# Patient Record
Sex: Female | Born: 1995 | Race: White | Hispanic: No | Marital: Single | Smoking: Never smoker
Health system: Southern US, Community
[De-identification: ages and names within clinical notes are randomized; demographics above are authoritative.]

## PROBLEM LIST (undated history)

## (undated) ENCOUNTER — Inpatient Hospital Stay (HOSPITAL_COMMUNITY): Payer: Self-pay

## (undated) DIAGNOSIS — T7840XA Allergy, unspecified, initial encounter: Secondary | ICD-10-CM

## (undated) DIAGNOSIS — J309 Allergic rhinitis, unspecified: Principal | ICD-10-CM

## (undated) HISTORY — DX: Allergy, unspecified, initial encounter: T78.40XA

## (undated) HISTORY — PX: NO PAST SURGERIES: SHX2092

## (undated) HISTORY — DX: Allergic rhinitis, unspecified: J30.9

---
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
## 2014-01-11 ENCOUNTER — Ambulatory Visit: Payer: Medicaid Other | Admitting: Pediatrics

## 2014-07-18 DIAGNOSIS — Z0271 Encounter for disability determination: Secondary | ICD-10-CM

## 2014-09-26 ENCOUNTER — Other Ambulatory Visit: Payer: Self-pay | Admitting: Pediatrics

## 2014-09-26 ENCOUNTER — Encounter: Payer: Self-pay | Admitting: Pediatrics

## 2014-09-26 ENCOUNTER — Ambulatory Visit (INDEPENDENT_AMBULATORY_CARE_PROVIDER_SITE_OTHER): Payer: Medicaid Other | Admitting: Pediatrics

## 2014-09-26 VITALS — Wt 130.4 lb

## 2014-09-26 DIAGNOSIS — R1033 Periumbilical pain: Secondary | ICD-10-CM | POA: Diagnosis not present

## 2014-09-26 DIAGNOSIS — Z3202 Encounter for pregnancy test, result negative: Secondary | ICD-10-CM

## 2014-09-26 DIAGNOSIS — Z7251 High risk heterosexual behavior: Secondary | ICD-10-CM | POA: Diagnosis not present

## 2014-09-26 DIAGNOSIS — F329 Major depressive disorder, single episode, unspecified: Secondary | ICD-10-CM

## 2014-09-26 DIAGNOSIS — F32A Depression, unspecified: Secondary | ICD-10-CM

## 2014-09-26 LAB — POCT URINALYSIS DIPSTICK
Bilirubin, UA: NORMAL
Blood, UA: 250
Glucose, UA: NORMAL
Ketones, UA: NEGATIVE
NITRITE UA: NEGATIVE
PH UA: 5
PROTEIN UA: 1
Spec Grav, UA: 1.02
UROBILINOGEN UA: NEGATIVE

## 2014-09-26 LAB — POCT URINE PREGNANCY: PREG TEST UR: NEGATIVE

## 2014-09-26 NOTE — Progress Notes (Signed)
Subjective:     Patient ID: Jillian Richardson, female   DOB: 1995/10/31, 19 y.o.   MRN: [id number]  HPI :  19 year old female in by herself with c/o periumbilical abdominal pain which started 3-4 days ago.  She describes it as intermittent and sharp.  Four days ago she had unprotected sex with a new partner.  She is not aware that he had infection.  She denies dysuria, lower abd pain or vaginal discharge.  Denies nausea, vomiting, diarrhea, constipation, fever or flank pain.  She is on her menses now.  Has hx of depression and self-injury.  Is not in counseling.  Met with Sherilyn Dacosta, [facility], last year but did not schedule follow-up.   Review of Systems  Constitutional: Positive for appetite change. Negative for fever.  HENT: Negative.   Respiratory: Negative.   Gastrointestinal: Negative for nausea, vomiting, diarrhea and constipation.  Genitourinary: Negative for dysuria, frequency, flank pain, vaginal discharge, genital sores, menstrual problem, pelvic pain and dyspareunia.       Objective:   Physical Exam  Constitutional: She appears well-developed and well-nourished.  Pulmonary/Chest:  No flank pain  Abdominal: Soft. Bowel sounds are normal. She exhibits no distension and no mass. There is no tenderness.  Genitourinary:  Genitalia/pelvic exam deferred because no symptoms and on menses  Nursing note and vitals reviewed.      Assessment:     Recent unprotected sex Abdominal pain Hx of depression- missed [facility]     Plan:     Labs per orders  Will call patient with any abnormal results:  [phone]  Discussed birth control and STI's- gave pamphlets and condoms  Referral to [facility], [facility]    Ander Slade, PPCNP-BC

## 2014-09-27 LAB — GC/CHLAMYDIA PROBE AMP, URINE
CHLAMYDIA, SWAB/URINE, PCR: NEGATIVE
GC PROBE AMP, URINE: NEGATIVE

## 2014-09-27 LAB — URINE CULTURE: Colony Count: 60000

## 2015-02-07 ENCOUNTER — Ambulatory Visit: Payer: Medicaid Other | Admitting: Pediatrics

## 2015-02-12 ENCOUNTER — Ambulatory Visit (INDEPENDENT_AMBULATORY_CARE_PROVIDER_SITE_OTHER): Payer: Self-pay | Admitting: Pediatrics

## 2015-02-12 ENCOUNTER — Encounter: Payer: Self-pay | Admitting: Pediatrics

## 2015-02-12 ENCOUNTER — Encounter (INDEPENDENT_AMBULATORY_CARE_PROVIDER_SITE_OTHER): Payer: Self-pay

## 2015-02-12 VITALS — BP 100/80 | Ht 59.06 in | Wt 133.0 lb

## 2015-02-12 DIAGNOSIS — J0121 Acute recurrent ethmoidal sinusitis: Secondary | ICD-10-CM

## 2015-02-12 DIAGNOSIS — IMO0002 Reserved for concepts with insufficient information to code with codable children: Secondary | ICD-10-CM

## 2015-02-12 DIAGNOSIS — Z00129 Encounter for routine child health examination without abnormal findings: Secondary | ICD-10-CM

## 2015-02-12 DIAGNOSIS — Z23 Encounter for immunization: Secondary | ICD-10-CM

## 2015-02-12 DIAGNOSIS — Z113 Encounter for screening for infections with a predominantly sexual mode of transmission: Secondary | ICD-10-CM

## 2015-02-12 DIAGNOSIS — Z3202 Encounter for pregnancy test, result negative: Secondary | ICD-10-CM

## 2015-02-12 DIAGNOSIS — Z68.41 Body mass index (BMI) pediatric, greater than or equal to 95th percentile for age: Secondary | ICD-10-CM

## 2015-02-12 DIAGNOSIS — Z00121 Encounter for routine child health examination with abnormal findings: Secondary | ICD-10-CM

## 2015-02-12 MED ORDER — FLUTICASONE PROPIONATE 50 MCG/ACT NA SUSP: 2.0000 | Freq: Every day | NASAL | Status: DC

## 2015-02-12 MED ORDER — CETIRIZINE HCL 10 MG PO TABS: 10.0000 mg | ORAL_TABLET | Freq: Every day | ORAL | Status: DC

## 2015-02-12 NOTE — Progress Notes (Signed)
Routine Well-Adolescent Visit  PCP: TEBBEN,JACQUELINE, NP   History was provided by the mother. And patient  Jillian Richardson is a 19 y.o. female who is here for physical and to discuss birth control and any needed immunizations.  Current concerns: She thinks that she may be pregnant.  She wants HPV immunizations.  She needs her allergy meds. She is thinking about birth control.  She is sexually active but they are only using condoms.  She thinks she wants BCP but will considerate implant.  No other complaints.    Adolescent Assessment:  Confidentiality was discussed with the patient and if applicable, with caregiver as well.  Home and Environment:  Lives with: lives at home with mother and 3 younger sisters. Parental relations: good Friends/Peers: has a steady boyfriend.   Nutrition/Eating Behaviors: no concerns Sports/Exercise:  Walks.  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  Is not in school but would like to attend.  No idea what she would like to study.   Work: babysits Activities:   With parent out of the room and confidentiality discussed:   Patient reports being comfortable and safe at school and at home? Yes  Smoking: no Secondhand smoke exposure? no Drugs/EtOH: no  Menstruation:   Menarche: post menarchal, onset since early teens last menses if female: at present Menstrual History: flow is moderate   Sexuality: Sexually active? yes - 1 boyfriend sexual partners in last year:1 contraception use: condoms Last STI Screening: today  Mood: Suicidality and Depression: off and on depressed because she is looking for a job. Weapons:   Screenings: The patient completed the Rapid Assessment for Adolescent Preventive Services screening questionnaire and the following topics were identified as risk factors and discussed: healthy eating, exercise, condom use and birth control  In addition, the  following topics were discussed as part of anticipatory guidance mental health issues.  PHQ-9 completed and results indicated no major issues  Physical Exam:  BP 100/80 mmHg  Ht 4' 11.06" (1.5 m)  Wt 133 lb (60.328 kg)  BMI 26.81 kg/m2  LMP 02/09/2015 (Exact Date) Facility age limit for growth percentiles is 20 years.  General Appearance:   alert, oriented, no acute distress  HENT: Normocephalic, no obvious abnormality, conjunctiva clear  Mouth:   Normal appearing teeth, no obvious discoloration, dental caries, or dental caps  Neck:   Supple; thyroid: no enlargement, symmetric, no tenderness/mass/nodules  Lungs:   Clear to auscultation bilaterally, normal work of breathing  Heart:   Regular rate and rhythm, S1 and S2 normal, no murmurs;   Abdomen:   Soft, non-tender, no mass, or organomegaly  GU normal female external genitalia, pelvic not performed  Musculoskeletal:   Tone and strength strong and symmetrical, all extremities               Lymphatic:   No cervical adenopathy  Skin/Hair/Nails:   Skin warm, dry and intact, no rashes, no bruises or petechiae.    Neurologic:   Strength, gait, and coordination normal and age-appropriate    Assessment/Plan:  BMI: is not appropriate for age  Immunizations today: per orders.  Deferred and referred to HD for HPV.  She is aged out here.  - Follow-up visit in 1 week for next visit, or sooner as needed.  Will discuss her decision about BC.  We are available to place nexplanon if she agrees.  PEREZ-FIERY,Devesh Monforte, MD

## 2015-02-13 LAB — GC/CHLAMYDIA PROBE AMP, URINE
Chlamydia, Swab/Urine, PCR: NEGATIVE
GC Probe Amp, Urine: NEGATIVE

## 2015-02-19 ENCOUNTER — Encounter: Payer: Self-pay | Admitting: Pediatrics

## 2015-02-19 ENCOUNTER — Ambulatory Visit (INDEPENDENT_AMBULATORY_CARE_PROVIDER_SITE_OTHER): Payer: Medicaid Other | Admitting: Pediatrics

## 2015-02-19 VITALS — BP 104/62 | Ht 59.0 in | Wt 131.0 lb

## 2015-02-19 DIAGNOSIS — Z7251 High risk heterosexual behavior: Secondary | ICD-10-CM

## 2015-02-19 NOTE — Progress Notes (Signed)
Subjective:     Patient ID: Jillian Richardson, female   DOB: 07-Dec-1995, 19 y.o.   MRN: [id number]  HPI  Patient returns for follow up today.  [facility] and [facility] was discussed.  [facility].  We discussed the ease and reliability of nexplanon and she was going to think about it over the weekend.  She has been sexually active and said that she had a stable boyfriend. She returned today with her mom.  (Mom was also here last week and was involved in the conversation).  She said that she had decided that she was not going to be sexually active and did not want either the pill or the implant. We discussed need to anticipate sex and seek BC ahead if she decides to have intercourse.   I asked her if she wanted to speak with Neysa BonitoChristy from adolescent clinic to answer any questions she may have but she declined.   I said that any time she had any questions or concerns to please call us.  She agreed and said she would.  She has MA until she is 19 years of age she stated and would continue to come here.     Review of Systems  Constitutional: Negative.   All other systems reviewed and are negative.      Objective:   Physical Exam deferred     Assessment:     Decided to decline birth control  Has decided to abstain from sex.    Plan:     Will call us if she changes her mind and is in need of BC.  Maia Breslowenise Perez Fiery, MD

## 2015-02-25 ENCOUNTER — Encounter: Payer: Self-pay | Admitting: Pediatrics

## 2015-03-01 ENCOUNTER — Ambulatory Visit: Payer: Medicaid Other | Admitting: Pediatrics

## 2015-03-01 ENCOUNTER — Encounter: Payer: No Typology Code available for payment source | Admitting: Licensed Clinical Social Worker

## 2015-03-03 ENCOUNTER — Encounter (HOSPITAL_COMMUNITY): Payer: Self-pay

## 2015-03-03 ENCOUNTER — Emergency Department (HOSPITAL_COMMUNITY)
Admission: EM | Admit: 2015-03-03 | Discharge: 2015-03-03 | Disposition: A | Discharge status: Home / Self Care | Payer: Self-pay | Attending: Emergency Medicine | Admitting: Emergency Medicine

## 2015-03-03 DIAGNOSIS — Z7951 Long term (current) use of inhaled steroids: Secondary | ICD-10-CM | POA: Insufficient documentation

## 2015-03-03 DIAGNOSIS — N764 Abscess of vulva: Secondary | ICD-10-CM | POA: Insufficient documentation

## 2015-03-03 DIAGNOSIS — Z79899 Other long term (current) drug therapy: Secondary | ICD-10-CM | POA: Insufficient documentation

## 2015-03-03 MED ORDER — SULFAMETHOXAZOLE-TRIMETHOPRIM 800-160 MG PO TABS: 1.0000 | ORAL_TABLET | Freq: Two times a day (BID) | ORAL | Status: AC

## 2015-03-03 MED ORDER — LIDOCAINE-EPINEPHRINE (PF) 2 %-1:200000 IJ SOLN
20.0000 mL | Freq: Once | INTRAMUSCULAR | Status: AC
  Administered 2015-03-03: 20 mL
  Filled 2015-03-03: qty 20

## 2015-03-03 NOTE — ED Notes (Signed)
Assisted provider w/ pelvic procedure. Pt tolerated well.

## 2015-03-03 NOTE — ED Notes (Signed)
Pt here with parents in triage and reports an abscess to her left labia since 2 days ago.

## 2015-03-03 NOTE — Discharge Instructions (Signed)

## 2015-03-03 NOTE — ED Provider Notes (Signed)
CSN: [id number]     Arrival date & time 03/03/15  0106 History   This chart was scribed for Azalia Bilis, MD by Evon Slack, ED Scribe. This patient was seen in room A04C/A04C and the patient's care was started at 1:30 AM.      Chief Complaint  Patient presents with  . Abscess    The history is provided by the patient. No language interpreter was used.   HPI Comments: Jillian Richardson is a 19 y.o. female who presents to the Emergency Department complaining of new painful abscess on her left labia onset 2 days prior. Pt doesn't report treatments tried or medications PTA. Pt denies fever or chills.    Past Medical History  Diagnosis Date  . Allergy   . Allergic rhinitis 10/23/2013   History reviewed. No pertinent past surgical history. No family history on file. History  Substance Use Topics  . Smoking status: Never Smoker   . Smokeless tobacco: Not on file  . Alcohol Use: No   OB History    No data available     Review of Systems  Constitutional: Negative for fever and chills.  Skin:       Left labia abscess.   All other systems reviewed and are negative.    Allergies  Review of patient's allergies indicates no known allergies.  Home Medications   Prior to Admission medications   Medication Sig Start Date End Date Taking? Authorizing Provider  cetirizine (ZYRTEC) 10 MG tablet Take 1 tablet (10 mg total) by mouth daily. 02/12/15   Maia Breslow, MD  fluticasone (FLONASE) 50 MCG/ACT nasal spray Place 2 sprays into both nostrils daily. 02/12/15   Denise Perez-Fiery, MD   BP 106/64 mmHg  Pulse 89  Temp(Src) 98.6 F (37 C) (Oral)  Resp 15  Ht  (1.499 m)  Wt 133 lb 4 oz (60.442 kg)  BMI 26.90 kg/m2  SpO2 100%  LMP 02/09/2015 (Exact Date)   Physical Exam  Constitutional: She is oriented to person, place, and time. She appears well-developed and well-nourished. No distress.  HENT:  Head: Normocephalic and atraumatic.  Eyes: EOM are normal.  Neck:  Normal range of motion.  Cardiovascular: Normal rate, regular rhythm and normal heart sounds.   Pulmonary/Chest: Effort normal and breath sounds normal.  Abdominal: Soft. She exhibits no distension. There is no tenderness.  Genitourinary:  Left labia abscess with surrounding erythema, moderate amount of drainage. Chaperone Present.   Musculoskeletal: Normal range of motion.  Neurological: She is alert and oriented to person, place, and time.  Skin: Skin is warm and dry.  Psychiatric: She has a normal mood and affect. Judgment normal.  Nursing note and vitals reviewed.   ED Course  Procedures (including critical care time)  INCISION AND DRAINAGE PROCEDURE NOTE: Patient identification was confirmed and verbal consent was obtained. This procedure was performed by Azalia Bilis, MD at 2:12 AM. Site: Left labia Sterile procedures observed: Yes Needle size: 24 Gauge  Anesthetic used (type and amt): 2% lidocaine with Epi Blade size: 14 Drainage: small pus Complexity: Complex Packing used: None Site anesthetized, incision made over site, wound drained and explored loculations, rinsed with copious amounts of normal saline, wound packed with sterile gauze, covered with dry, sterile dressing.  Pt tolerated procedure well without complications.  Instructions for care discussed verbally and pt provided with additional written instructions for homecare and f/u.  DIAGNOSTIC STUDIES: Oxygen Saturation is 100% on RA, normal by my interpretation.    COORDINATION  OF CARE: 2:09 AM-Discussed treatment plan with pt at bedside and pt agreed to plan.     Labs Review Labs Reviewed - No data to display  Imaging Review No results found.   EKG Interpretation None      MDM   Final diagnoses:  Abscess of left genital labia   Tolerated the procedure well.  Discharge home in good condition.  Primary care follow-up.  Infection warnings given.  Patient understands to return the ER for new or  worsening symptoms.  Home on Bactrim.   I personally performed the services described in this documentation, which was scribed in my presence. The recorded information has been reviewed and is accurate.        Azalia Bilis, MD 03/03/15 [phone]

## 2015-03-06 ENCOUNTER — Encounter: Payer: No Typology Code available for payment source | Admitting: Licensed Clinical Social Worker

## 2015-03-06 ENCOUNTER — Ambulatory Visit: Payer: Medicaid Other | Admitting: Pediatrics

## 2015-03-06 ENCOUNTER — Telehealth: Payer: Self-pay | Admitting: Licensed Clinical Social Worker

## 2015-03-06 NOTE — Telephone Encounter (Signed)
LVM checking in on today's appt and asked if patient would be able to attend? Although it is well-documented that patient has been informed of the no-show policy, reiterated that policy and encouraged Karie to call with questions.   A few minutes later, patient officially no-showed appt.  Clide Deutscher, MSW, [employer] [facility]

## 2015-05-14 ENCOUNTER — Encounter: Payer: Self-pay | Admitting: Pediatrics

## 2015-07-15 ENCOUNTER — Telehealth: Payer: Self-pay | Admitting: Pediatrics

## 2015-07-15 NOTE — Telephone Encounter (Signed)
Left message for patient to call to schedule new patient appointment

## 2015-07-21 ENCOUNTER — Encounter: Payer: Self-pay | Admitting: Family Medicine

## 2015-07-22 ENCOUNTER — Encounter: Payer: Self-pay | Admitting: Pediatrics

## 2015-08-01 ENCOUNTER — Emergency Department (HOSPITAL_COMMUNITY)
Admission: EM | Admit: 2015-08-01 | Discharge: 2015-08-01 | Disposition: A | Discharge status: Home / Self Care | Payer: Medicaid Other | Attending: Emergency Medicine | Admitting: Emergency Medicine

## 2015-08-01 ENCOUNTER — Encounter (HOSPITAL_COMMUNITY): Payer: Self-pay | Admitting: Emergency Medicine

## 2015-08-01 DIAGNOSIS — Z8709 Personal history of other diseases of the respiratory system: Secondary | ICD-10-CM | POA: Insufficient documentation

## 2015-08-01 DIAGNOSIS — Z3202 Encounter for pregnancy test, result negative: Secondary | ICD-10-CM | POA: Insufficient documentation

## 2015-08-01 DIAGNOSIS — Z79899 Other long term (current) drug therapy: Secondary | ICD-10-CM | POA: Insufficient documentation

## 2015-08-01 DIAGNOSIS — L739 Follicular disorder, unspecified: Secondary | ICD-10-CM

## 2015-08-01 DIAGNOSIS — Z7951 Long term (current) use of inhaled steroids: Secondary | ICD-10-CM | POA: Insufficient documentation

## 2015-08-01 LAB — POC URINE PREG, ED: Preg Test, Ur: NEGATIVE

## 2015-08-01 NOTE — ED Provider Notes (Signed)
CSN: [id number]     Arrival date & time 08/01/15  2007 History  By signing my name below, I, Emmanuella Mensah, attest that this documentation has been prepared under the direction and in the presence of General MillsBenjamin Dallas Torok, PA-C. Electronically Signed: Angelene GiovanniEmmanuella Mensah, ED Scribe. 08/01/2015. 9:15 PM.    Chief Complaint  Patient presents with  . Wound Check   The history is provided by the patient. No language interpreter was used.   HPI Comments: Jillian Richardson is a 19 y.o. female who presents to the Emergency Department complaining of gradually worsening mildly painful area of swelling and redness to the left lateral pubis. Onset this morning. No alleviating factors noted. No interventions tried PTA. Pt has a hx of a cyst in the same area but she did not have to have a catheter at that time. She denies any fever, chills, constipation, n/v, dizziness, urinary symptoms, vaginal bleeding, vaginal discharge, pelvic pain, abdominal pain , dysuria. No other modifying factors.  Pt also requests a pregnancy test because she is approx. 5-6 days late on her menstrual cycle.    Past Medical History  Diagnosis Date  . Allergy   . Allergic rhinitis 10/23/2013   History reviewed. No pertinent past surgical history. No family history on file. Social History  Substance Use Topics  . Smoking status: Never Smoker   . Smokeless tobacco: None  . Alcohol Use: No   OB History    No data available     Review of Systems  Constitutional: Negative for fever and chills.  Gastrointestinal: Negative for nausea, vomiting and constipation.  Genitourinary: Negative for dysuria, hematuria, vaginal bleeding, vaginal discharge and vaginal pain.  Skin: Negative for rash and wound.       Area of swelling and redness to left lateral pubis  Neurological: Negative for dizziness.  All other systems reviewed and are negative.     Allergies  Review of patient's allergies indicates no known allergies.  Home  Medications   Prior to Admission medications   Medication Sig Start Date End Date Taking? Authorizing Provider  cetirizine (ZYRTEC) 10 MG tablet Take 1 tablet (10 mg total) by mouth daily. 02/12/15   Maia Breslowenise Perez-Fiery, MD  fluticasone (FLONASE) 50 MCG/ACT nasal spray Place 2 sprays into both nostrils daily. 02/12/15   Angelique Blonderenise Perez-Fiery, MD   BP 126/68 mmHg  Pulse 88  Temp(Src) 98.6 F (37 C) (Oral)  Resp 18  Ht 4\' 11"  (1.499 m)  Wt 61.689 kg  BMI 27.45 kg/m2  SpO2 99%  LMP 06/28/2015 Physical Exam  Constitutional: She is oriented to person, place, and time. She appears well-developed and well-nourished. No distress.  HENT:  Head: Normocephalic and atraumatic.  Mouth/Throat: Oropharynx is clear and moist.  Eyes: Conjunctivae and EOM are normal. Pupils are equal, round, and reactive to light. Right eye exhibits no discharge. Left eye exhibits no discharge. No scleral icterus.  Neck: Neck supple. No tracheal deviation present.  Cardiovascular: Normal rate, regular rhythm and normal heart sounds.   Pulmonary/Chest: Effort normal and breath sounds normal. No respiratory distress. She has no wheezes. She has no rales.  Abdominal: Soft. There is no tenderness.  Genitourinary:  Female chaperone present for entirety of vaginal exam. External genitalia appears normal, no evidence of cyst or obvious abscess. There is evidence of mild folliculitis on the lateral pubis. No overt erythema or fluctuance or tenderness.  Musculoskeletal: Normal range of motion. She exhibits no tenderness.  Neurological: She is alert and oriented to person, place,  and time.  Cranial Nerves II-XII grossly intact  Skin: Skin is warm and dry. No rash noted.  Psychiatric: She has a normal mood and affect. Her behavior is normal.  Nursing note and vitals reviewed.   ED Course  Procedures (including critical care time) DIAGNOSTIC STUDIES: Oxygen Saturation is 100% on RA, normal by my interpretation.    COORDINATION  OF CARE: 9:09 PM- Pt advised of plan for treatment and pt agrees. Explained that area will not be I&D'ed and recommended to use warm compresses on area. Recommended to return if area continues to be more red or if pt develops fever.    Labs Review Labs Reviewed  POC URINE PREG, ED   Joycie Peek, PA-C has personally reviewed and evaluated these lab results as part of his medical decision-making.  Meds given in ED:  Medications - No data to display  Discharge Medication List as of 08/01/2015 10:13 PM     Filed Vitals:   08/01/15 2025 08/01/15 2030 08/01/15 2030 08/01/15 2216  BP:  142/89 121/63 126/68  Pulse:  83 93 88  Temp:   98.3 F (36.8 C) 98.6 F (37 C)  TempSrc:   Oral   Resp:  Height:  (1.499 m)     Weight: 61.689 kg     SpO2:  100% 100% 99%    MDM  Jillian Richardson is a 19 y.o. female presents for evaluation of possible abscess. Found to have mild folliculitis over her left pubis region. Patient admits to recently shaving the area. No evidence of abscess or cellulitis. Discussed warm compress, personal hygiene at home. Pregnancy test is negative. Patient denies any abdominal or other pelvic/vaginal complaints. She is appropriate for discharge and agrees with this plan, as does her mother at bedside. The patient appears reasonably screened and/or stabilized for discharge and I doubt any other medical condition or other [facility] requiring further screening, evaluation, or treatment in the ED at this time prior to discharge.   Final diagnoses:  Folliculitis    I personally performed the services described in this documentation, which was scribed in my presence. The recorded information has been reviewed and is accurate.   Joycie Peek, PA-C 08/01/15 2219  Joycie Peek, PA-C 08/01/15 1914  Margarita Grizzle, MD 08/03/15 [phone]

## 2015-08-01 NOTE — ED Notes (Signed)
States my period is 5-6 days late so I would like a pregnancy test and if its negative I want birth control.  Reports cramping but no bleeding.  Also reports having an absess to the labia that she wants checked out.

## 2015-08-01 NOTE — Discharge Instructions (Signed)
Follow-up with your doctor as needed for reevaluation. Return to ED for any new or worsening symptoms as we discussed  Folliculitis Folliculitis is redness, soreness, and swelling (inflammation) of the hair follicles. This condition can occur anywhere on the body. People with weakened immune systems, diabetes, or obesity have a greater risk of getting folliculitis. CAUSES  Bacterial infection. This is the most common cause.  Fungal infection.  Viral infection.  Contact with certain chemicals, especially oils and tars. Long-term folliculitis can result from bacteria that live in the nostrils. The bacteria may trigger multiple outbreaks of folliculitis over time. SYMPTOMS Folliculitis most commonly occurs on the scalp, thighs, legs, back, buttocks, and areas where hair is shaved frequently. An early sign of folliculitis is a small, white or yellow, pus-filled, itchy lesion (pustule). These lesions appear on a red, inflamed follicle. They are usually less than 0.2 inches (5 mm) wide. When there is an infection of the follicle that goes deeper, it becomes a boil or furuncle. A group of closely packed boils creates a larger lesion (carbuncle). Carbuncles tend to occur in hairy, sweaty areas of the body. DIAGNOSIS  Your caregiver can usually tell what is wrong by doing a physical exam. A sample may be taken from one of the lesions and tested in a lab. This can help determine what is causing your folliculitis. TREATMENT  Treatment may include:  Applying warm compresses to the affected areas.  Taking antibiotic medicines orally or applying them to the skin.  Draining the lesions if they contain a large amount of pus or fluid.  Laser hair removal for cases of long-lasting folliculitis. This helps to prevent regrowth of the hair. HOME CARE INSTRUCTIONS  Apply warm compresses to the affected areas as directed by your caregiver.  If antibiotics are prescribed, take them as directed. Finish them  even if you start to feel better.  You may take over-the-counter medicines to relieve itching.  Do not shave irritated skin.  Follow up with your caregiver as directed. SEEK IMMEDIATE MEDICAL CARE IF:   You have increasing redness, swelling, or pain in the affected area.  You have a fever. MAKE SURE YOU:  Understand these instructions.  Will watch your condition.  Will get help right away if you are not doing well or get worse.   This information is not intended to replace advice given to you by your health care provider. Make sure you discuss any questions you have with your health care provider.   Document Released: 09/28/2001 Document Revised: 08/10/2014 Document Reviewed: 10/20/2011 Elsevier Interactive Patient Education [employer].

## 2015-08-07 ENCOUNTER — Telehealth: Payer: Self-pay | Admitting: Pediatrics

## 2015-08-07 ENCOUNTER — Encounter: Payer: Self-pay | Admitting: Family Medicine

## 2015-08-07 NOTE — Telephone Encounter (Signed)
Left message for patient regarding cancellation of appointment for 08/08/15. Advised patient to call to reschedule. 

## 2015-08-08 ENCOUNTER — Ambulatory Visit: Payer: Medicaid Other | Admitting: Family Medicine

## 2015-08-18 ENCOUNTER — Encounter: Payer: Self-pay | Admitting: Family Medicine

## 2015-08-29 ENCOUNTER — Ambulatory Visit: Payer: Medicaid Other | Admitting: Family Medicine

## 2015-09-03 ENCOUNTER — Encounter: Payer: Self-pay | Admitting: Family Medicine

## 2015-10-04 ENCOUNTER — Encounter: Payer: Self-pay | Admitting: Family Medicine

## 2015-10-04 ENCOUNTER — Ambulatory Visit (INDEPENDENT_AMBULATORY_CARE_PROVIDER_SITE_OTHER): Payer: Self-pay | Admitting: Family Medicine

## 2015-10-04 DIAGNOSIS — Z0000 Encounter for general adult medical examination without abnormal findings: Secondary | ICD-10-CM

## 2015-10-04 LAB — COMPLETE METABOLIC PANEL WITH GFR
ALBUMIN: 4.5 g/dL (ref 3.6–5.1)
ALT: 11 U/L (ref 6–29)
AST: 12 U/L (ref 10–30)
Alkaline Phosphatase: 62 U/L (ref 33–115)
BILIRUBIN TOTAL: 0.2 mg/dL (ref 0.2–1.2)
BUN: 18 mg/dL (ref 7–25)
CHLORIDE: 101 mmol/L (ref 98–110)
CO2: 29 mmol/L (ref 20–31)
CREATININE: 0.58 mg/dL (ref 0.50–1.10)
Calcium: 9.6 mg/dL (ref 8.6–10.2)
GFR, Est African American: >89 mL/min (ref >=60)
GLUCOSE: 70 mg/dL (ref 65–99)
POTASSIUM: 4 mmol/L (ref 3.5–5.3)
SODIUM: 137 mmol/L (ref 135–146)
Total Protein: 7.1 g/dL (ref 6.1–8.1)

## 2015-10-04 LAB — HEMOGLOBIN A1C
Hgb A1c MFr Bld: 4.9 % (ref <5.7)
Mean Plasma Glucose: 94 mg/dL (ref <117)

## 2015-10-04 LAB — CBC WITH DIFFERENTIAL/PLATELET
BASOS PCT: 0 % (ref 0–1)
Basophils Absolute: 0 10*3/uL (ref 0.0–0.1)
EOS ABS: 0.3 10*3/uL (ref 0.0–0.7)
EOS PCT: 3 % (ref 0–5)
HCT: 38.7 % (ref 36.0–46.0)
HEMOGLOBIN: 13.1 g/dL (ref 12.0–15.0)
Lymphocytes Relative: 31 % (ref 12–46)
Lymphs Abs: 2.7 10*3/uL (ref 0.7–4.0)
MCH: 32.1 pg (ref 26.0–34.0)
MCHC: 33.9 g/dL (ref 30.0–36.0)
MCV: 94.9 fL (ref 78.0–100.0)
MONO ABS: 0.5 10*3/uL (ref 0.1–1.0)
MONOS PCT: 6 % (ref 3–12)
MPV: 10 fL (ref 8.6–12.4)
NEUTROS ABS: 5.2 10*3/uL (ref 1.7–7.7)
Neutrophils Relative %: 60 % (ref 43–77)
Platelets: 276 10*3/uL (ref 150–400)
RBC: 4.08 MIL/uL (ref 3.87–5.11)
RDW: 12.2 % (ref 11.5–15.5)
WBC: 8.7 10*3/uL (ref 4.0–10.5)

## 2015-10-04 NOTE — Patient Instructions (Signed)
Health Maintenance, Female Adopting a healthy lifestyle and getting preventive care can go a long way to promote health and wellness. Talk with your health care provider about what schedule of regular examinations is right for you. This is a good chance for you to check in with your provider about disease prevention and staying healthy. In between checkups, there are plenty of things you can do on your own. Experts have done a lot of research about which lifestyle changes and preventive measures are most likely to keep you healthy. Ask your health care provider for more information. WEIGHT AND DIET  Eat a healthy diet  Be sure to include plenty of vegetables, fruits, low-fat dairy products, and lean protein.  Do not eat a lot of foods high in solid fats, added sugars, or salt.  Get regular exercise. This is one of the most important things you can do for your health.  Most adults should exercise for at least 150 minutes each week. The exercise should increase your heart rate and make you sweat (moderate-intensity exercise).  Most adults should also do strengthening exercises at least twice a week. This is in addition to the moderate-intensity exercise.  Maintain a healthy weight  Body mass index (BMI) is a measurement that can be used to identify possible weight problems. It estimates body fat based on height and weight. Your health care provider can help determine your BMI and help you achieve or maintain a healthy weight.  For females 20 years of age and older:   A BMI below 18.5 is considered underweight.  A BMI of 18.5 to 24.9 is normal.  A BMI of 25 to 29.9 is considered overweight.  A BMI of 30 and above is considered obese.  Watch levels of cholesterol and blood lipids  You should start having your blood tested for lipids and cholesterol at 20 years of age, then have this test every 5 years.  You may need to have your cholesterol levels checked more often if:  Your lipid  or cholesterol levels are high.  You are older than 21 years of age.  You are at high risk for heart disease.  CANCER SCREENING   Lung Cancer  Lung cancer screening is recommended for adults 55-80 years old who are at high risk for lung cancer because of a history of smoking.  A yearly low-dose CT scan of the lungs is recommended for people who:  Currently smoke.  Have quit within the past 15 years.  Have at least a 30-pack-year history of smoking. A pack year is smoking an average of one pack of cigarettes a day for 1 year.  Yearly screening should continue until it has been 15 years since you quit.  Yearly screening should stop if you develop a health problem that would prevent you from having lung cancer treatment.  Breast Cancer  Practice breast self-awareness. This means understanding how your breasts normally appear and feel.  It also means doing regular breast self-exams. Let your health care provider know about any changes, no matter how small.  If you are in your 20s or 30s, you should have a clinical breast exam (CBE) by a health care provider every 1-3 years as part of a regular health exam.  If you are 40 or older, have a CBE every year. Also consider having a breast X-ray (mammogram) every year.  If you have a family history of breast cancer, talk to your health care provider about genetic screening.  If you   are at high risk for breast cancer, talk to your health care provider about having an MRI and a mammogram every year.  Breast cancer gene (BRCA) assessment is recommended for women who have family members with BRCA-related cancers. BRCA-related cancers include:  Breast.  Ovarian.  Tubal.  Peritoneal cancers.  Results of the assessment will determine the need for genetic counseling and BRCA1 and BRCA2 testing. Cervical Cancer Your health care provider may recommend that you be screened regularly for cancer of the pelvic organs (ovaries, uterus, and  vagina). This screening involves a pelvic examination, including checking for microscopic changes to the surface of your cervix (Pap test). You may be encouraged to have this screening done every 3 years, beginning at age 21.  For women ages 30-65, health care providers may recommend pelvic exams and Pap testing every 3 years, or they may recommend the Pap and pelvic exam, combined with testing for human papilloma virus (HPV), every 5 years. Some types of HPV increase your risk of cervical cancer. Testing for HPV may also be done on women of any age with unclear Pap test results.  Other health care providers may not recommend any screening for nonpregnant women who are considered low risk for pelvic cancer and who do not have symptoms. Ask your health care provider if a screening pelvic exam is right for you.  If you have had past treatment for cervical cancer or a condition that could lead to cancer, you need Pap tests and screening for cancer for at least 20 years after your treatment. If Pap tests have been discontinued, your risk factors (such as having a new sexual partner) need to be reassessed to determine if screening should resume. Some women have medical problems that increase the chance of getting cervical cancer. In these cases, your health care provider may recommend more frequent screening and Pap tests. Colorectal Cancer  This type of cancer can be detected and often prevented.  Routine colorectal cancer screening usually begins at 21 years of age and continues through 21 years of age.  Your health care provider may recommend screening at an earlier age if you have risk factors for colon cancer.  Your health care provider may also recommend using home test kits to check for hidden blood in the stool.  A small camera at the end of a tube can be used to examine your colon directly (sigmoidoscopy or colonoscopy). This is done to check for the earliest forms of colorectal  cancer.  Routine screening usually begins at age 50.  Direct examination of the colon should be repeated every 5-10 years through 21 years of age. However, you may need to be screened more often if early forms of precancerous polyps or small growths are found. Skin Cancer  Check your skin from head to toe regularly.  Tell your health care provider about any new moles or changes in moles, especially if there is a change in a mole's shape or color.  Also tell your health care provider if you have a mole that is larger than the size of a pencil eraser.  Always use sunscreen. Apply sunscreen liberally and repeatedly throughout the day.  Protect yourself by wearing long sleeves, pants, a wide-brimmed hat, and sunglasses whenever you are outside. HEART DISEASE, DIABETES, AND HIGH BLOOD PRESSURE   High blood pressure causes heart disease and increases the risk of stroke. High blood pressure is more likely to develop in:  People who have blood pressure in the high end   of the normal range (130-139/85-89 mm Hg).  People who are overweight or obese.  People who are African American.  If you are 38-23 years of age, have your blood pressure checked every 3-5 years. If you are 61 years of age or older, have your blood pressure checked every year. You should have your blood pressure measured twice--once when you are at a hospital or clinic, and once when you are not at a hospital or clinic. Record the average of the two measurements. To check your blood pressure when you are not at a hospital or clinic, you can use:  An automated blood pressure machine at a pharmacy.  A home blood pressure monitor.  If you are between 45 years and 39 years old, ask your health care provider if you should take aspirin to prevent strokes.  Have regular diabetes screenings. This involves taking a blood sample to check your fasting blood sugar level.  If you are at a normal weight and have a low risk for diabetes,  have this test once every three years after 21 years of age.  If you are overweight and have a high risk for diabetes, consider being tested at a younger age or more often. PREVENTING INFECTION  Hepatitis B  If you have a higher risk for hepatitis B, you should be screened for this virus. You are considered at high risk for hepatitis B if:  You were born in a country where hepatitis B is common. Ask your health care provider which countries are considered high risk.  Your parents were born in a high-risk country, and you have not been immunized against hepatitis B (hepatitis B vaccine).  You have HIV or AIDS.  You use needles to inject street drugs.  You live with someone who has hepatitis B.  You have had sex with someone who has hepatitis B.  You get hemodialysis treatment.  You take certain medicines for conditions, including cancer, organ transplantation, and autoimmune conditions. Hepatitis C  Blood testing is recommended for:  Everyone born from [birth year] through 1966.  Anyone with known risk factors for hepatitis C. Sexually transmitted infections (STIs)  You should be screened for sexually transmitted infections (STIs) including gonorrhea and chlamydia if:  You are sexually active and are younger than 21 years of age.  You are older than 21 years of age and your health care provider tells you that you are at risk for this type of infection.  Your sexual activity has changed since you were last screened and you are at an increased risk for chlamydia or gonorrhea. Ask your health care provider if you are at risk.  If you do not have HIV, but are at risk, it may be recommended that you take a prescription medicine daily to prevent HIV infection. This is called pre-exposure prophylaxis (PrEP). You are considered at risk if:  You are sexually active and do not regularly use condoms or know the HIV status of your partner(s).  You take drugs by injection.  You are sexually  active with a partner who has HIV. Talk with your health care provider about whether you are at high risk of being infected with HIV. If you choose to begin PrEP, you should first be tested for HIV. You should then be tested every 3 months for as long as you are taking PrEP.  PREGNANCY   If you are premenopausal and you may become pregnant, ask your health care provider about preconception counseling.  If you may  become pregnant, take 400 to 800 micrograms (mcg) of folic acid every day.  If you want to prevent pregnancy, talk to your health care provider about birth control (contraception). OSTEOPOROSIS AND MENOPAUSE   Osteoporosis is a disease in which the bones lose minerals and strength with aging. This can result in serious bone fractures. Your risk for osteoporosis can be identified using a bone density scan.  If you are 61 years of age or older, or if you are at risk for osteoporosis and fractures, ask your health care provider if you should be screened.  Ask your health care provider whether you should take a calcium or vitamin D supplement to lower your risk for osteoporosis.  Menopause may have certain physical symptoms and risks.  Hormone replacement therapy may reduce some of these symptoms and risks. Talk to your health care provider about whether hormone replacement therapy is right for you.  HOME CARE INSTRUCTIONS   Schedule regular health, dental, and eye exams.  Stay current with your immunizations.   Do not use any tobacco products including cigarettes, chewing tobacco, or electronic cigarettes.  If you are pregnant, do not drink alcohol.  If you are breastfeeding, limit how much and how often you drink alcohol.  Limit alcohol intake to no more than 1 drink per day for nonpregnant women. One drink equals 12 ounces of beer, 5 ounces of wine, or 1 ounces of hard liquor.  Do not use street drugs.  Do not share needles.  Ask your health care provider for help if  you need support or information about quitting drugs.  Tell your health care provider if you often feel depressed.  Tell your health care provider if you have ever been abused or do not feel safe at home.   This information is not intended to replace advice given to you by your health care provider. Make sure you discuss any questions you have with your health care provider.   Document Released: 02/02/2011 Document Revised: 08/10/2014 Document Reviewed: 06/21/2013 Elsevier Interactive Patient Education [employer].

## 2015-10-04 NOTE — Progress Notes (Signed)
 Subjective:    Patient ID: Jillian Richardson, female    DOB: 09/24/1995, 19 y.o.   MRN: [id number]  HPI [name], a 19 year old female with a history of hypertension. She states that she was a patient of Dr Denise Perez-Fiery, pediatrician prior to 1 year ago. She maintains that she fells well and is without complaint. [name] has a normal menstrual cycle. Her last menstrual period started on 10/02/2015. She has not been sexually active in greater than 1 year. She denies performing monthly self-breast examination. She does not wear glasses or contact lenses.  Patient states that it has been several years since last dental visit. She does not eat a balanced diet or drink water. She denies headache, fatigue, chest pain, palpitations, dysuria, nausea, vomiting, or diarrhea.   Past Medical History  Diagnosis Date  . Allergy   . Allergic rhinitis 10/23/2013   Immunization History  Administered Date(s) Administered  . DTaP 12/28/1995, 04/19/1996, 06/14/1996, 01/30/1997, 04/10/1999  . Hepatitis A, Ped/Adol-2 Dose 12/05/2013  . Hepatitis B 12/27/1994, 12/28/1995, 04/19/1996  . HiB (PRP-OMP) 04/19/1996  . IPV 12/28/1995, 04/19/1996, 06/14/1996, 04/10/1999  . MMR 12/28/1995, 04/10/1999  . Meningococcal Conjugate 12/05/2013  . Tdap 03/26/2007  . Varicella 12/05/2013  History reviewed. No pertinent past surgical history.   Review of Systems  Constitutional: Negative.  Negative for unexpected weight change.  HENT: Negative.   Eyes: Negative.   Respiratory: Negative.   Cardiovascular: Negative.  Negative for chest pain, palpitations and leg swelling.  Gastrointestinal: Negative.   Endocrine: Negative.   Genitourinary: Negative.  Negative for hematuria, menstrual problem and pelvic pain.  Musculoskeletal: Negative for myalgias and joint swelling.  Allergic/Immunologic: Negative.   Neurological: Negative.   Hematological: Negative.   Psychiatric/Behavioral: Negative.       Objective:   Physical Exam  Constitutional: She is oriented to person, place, and time. She appears well-developed and well-nourished.  HENT:  Head: Normocephalic and atraumatic.  Right Ear: External ear normal.  Left Ear: External ear normal.  Nose: Nose normal.  Mouth/Throat: Oropharynx is clear and moist. Abnormal dentition. Dental caries present.  Eyes: Conjunctivae and EOM are normal. Pupils are equal, round, and reactive to light.  Neck: Normal range of motion. Neck supple.  Cardiovascular: Normal rate, regular rhythm, normal heart sounds and intact distal pulses.   Pulmonary/Chest: Effort normal and breath sounds normal.  Abdominal: Soft. Bowel sounds are normal.  Musculoskeletal: Normal range of motion.  Neurological: She is alert and oriented to person, place, and time. She has normal reflexes.  Skin: Skin is warm and dry.  Psychiatric: She has a normal mood and affect. Her behavior is normal. Judgment and thought content normal.     BP 115/56 mmHg  Pulse 88  Temp(Src) 98.7 F (37.1 C) (Oral)  Resp 18  Ht 4' 11" (1.499 m)  Wt 147 lb (66.679 kg)  BMI 29.67 kg/m2  SpO2 100%  LMP 10/04/2015 Assessment & Plan:   1. Annual physical exam Recommend monthly self-breast exams as discussed.  Recommend barrier protection with sexual intercourse -Recommend a lowfat -low carbohydrate diet divided over 5-6 small meal -Increase water intake to 6-8 glasses -150 minutes per week of cardiovascular exercise.  - CBC with Differential - COMPLETE METABOLIC PANEL WITH GFR - Hemoglobin A1c - TSH - Lipid Panel; Future - Flu Vaccine QUAD 36+ mos PF IM (Fluarix & Fluzone Quad PF) -Recommend dental examination  -Will follow up in 3 months for routine gynecological exam and pap   smear.  -Also recommend starting HPV series   RTC: 3 months for pap smear     Hollis,Lachina M, FNP   The patient was given clear instructions to go to ER or return to medical center if symptoms do not  improve, worsen or new problems develop. The patient verbalized understanding. Will notify patient with laboratory results.         

## 2015-10-04 NOTE — Progress Notes (Signed)
Patient is here to Establish Care as a new patient.  Patient denies pain at this time.  Patient would like the flu shot today

## 2015-10-05 LAB — TSH: TSH: 3.68 m[IU]/L

## 2015-10-07 ENCOUNTER — Telehealth: Payer: Self-pay

## 2015-10-07 NOTE — Telephone Encounter (Signed)
Called, patient was not home. Left a message with father to have her call us back when available. Thanks!

## 2015-10-07 NOTE — Telephone Encounter (Signed)
-----   Message from Massie MaroonLachina M Hollis, [place] sent at 10/07/2015 10:57 AM EST ----- Regarding: lab results Please inform Jillian Richardson that all labs are within a normal range. Please follow up in office as needed.   Thanks ----- Message -----    From: [facility]    Sent: 10/05/2015   1:27 AM      To: Massie MaroonLachina M Hollis, FNP

## 2015-10-08 NOTE — Telephone Encounter (Signed)
Patient returned call and I advised of normal labs and to follow up only when needed. Thanks!

## 2015-10-09 ENCOUNTER — Encounter: Payer: Self-pay | Admitting: Family Medicine

## 2015-10-11 ENCOUNTER — Other Ambulatory Visit (INDEPENDENT_AMBULATORY_CARE_PROVIDER_SITE_OTHER): Payer: Self-pay

## 2015-10-11 DIAGNOSIS — Z0000 Encounter for general adult medical examination without abnormal findings: Secondary | ICD-10-CM

## 2015-10-11 LAB — LIPID PANEL
CHOLESTEROL: 191 mg/dL — AB (ref 125–170)
HDL: 70 mg/dL (ref >=46)
LDL Cholesterol: 93 mg/dL (ref <110)
TRIGLYCERIDES: 141 mg/dL (ref <150)
Total CHOL/HDL Ratio: 2.7 Ratio (ref <=5.0)
VLDL: 28 mg/dL (ref <30)

## 2015-10-25 ENCOUNTER — Other Ambulatory Visit: Payer: Self-pay

## 2015-11-05 ENCOUNTER — Encounter: Payer: Self-pay | Admitting: Family Medicine

## 2016-01-06 ENCOUNTER — Ambulatory Visit (INDEPENDENT_AMBULATORY_CARE_PROVIDER_SITE_OTHER): Payer: Medicaid Other | Admitting: Family Medicine

## 2016-01-06 ENCOUNTER — Encounter: Payer: Self-pay | Admitting: Family Medicine

## 2016-01-06 ENCOUNTER — Other Ambulatory Visit (HOSPITAL_COMMUNITY)
Admission: RE | Admit: 2016-01-06 | Discharge: 2016-01-06 | Disposition: A | Discharge status: Home / Self Care | Payer: Medicaid Other | Source: Ambulatory Visit | Attending: Family Medicine | Admitting: Family Medicine

## 2016-01-06 VITALS — BP 115/63 | HR 92 | Temp 98.3°F | Resp 14 | Wt 154.0 lb

## 2016-01-06 DIAGNOSIS — Z01419 Encounter for gynecological examination (general) (routine) without abnormal findings: Secondary | ICD-10-CM

## 2016-01-06 DIAGNOSIS — Z113 Encounter for screening for infections with a predominantly sexual mode of transmission: Secondary | ICD-10-CM | POA: Insufficient documentation

## 2016-01-06 DIAGNOSIS — Z0000 Encounter for general adult medical examination without abnormal findings: Secondary | ICD-10-CM

## 2016-01-06 DIAGNOSIS — N760 Acute vaginitis: Secondary | ICD-10-CM | POA: Insufficient documentation

## 2016-01-06 NOTE — Progress Notes (Signed)
Subjective:    Patient ID: Jillian Richardson, female    DOB: 10/03/1995, 20 y.o.   MRN: [id number]  Gynecologic Exam The patient's pertinent negatives include no genital itching, genital lesions, genital odor, genital rash, missed menses, pelvic pain, vaginal bleeding or vaginal discharge. The patient is experiencing no pain. She is not pregnant. Pertinent negatives include no abdominal pain, anorexia, back pain, chills, constipation, diarrhea, discolored urine, dysuria, fever, flank pain, frequency, headaches, hematuria, joint pain, joint swelling, nausea, painful intercourse, rash, sore throat, urgency or vomiting. She is not sexually active. She uses nothing for contraception. Her menstrual history has been regular. There is no history of a Cesarean section, an ectopic pregnancy, herpes simplex, miscarriage, perineal abscess, an STD, a terminated pregnancy or vaginosis.   Immunization History  Administered Date(s) Administered  . DTaP 12/28/1995, 04/19/1996, 06/14/1996, 01/30/1997, 04/10/1999  . Hepatitis A, Ped/Adol-2 Dose 12/05/2013  . Hepatitis B 12/07/94, 12/28/1995, 04/19/1996  . HiB (PRP-OMP) 04/19/1996  . IPV 12/28/1995, 04/19/1996, 06/14/1996, 04/10/1999  . Influenza,inj,Quad PF,36+ Mos 10/04/2015  . MMR 12/28/1995, 04/10/1999  . Meningococcal Conjugate 12/05/2013  . Tdap 03/26/2007  . Varicella 12/05/2013  No Known Allergies  Social History   Social History  . Marital Status: Single    Spouse Name: N/A  . Number of Children: N/A  . Years of Education: N/A   Occupational History  . Not on file.   Social History Main Topics  . Smoking status: Never Smoker   . Smokeless tobacco: Not on file  . Alcohol Use: No  . Drug Use: No  . Sexual Activity: Yes    Birth Control/ Protection: None   Other Topics Concern  . Not on file   Social History Narrative   Review of Systems  Constitutional: Negative for fever and chills.  HENT: Negative.  Negative for sore throat.    Eyes: Negative.   Respiratory: Negative.   Cardiovascular: Negative.   Gastrointestinal: Negative.  Negative for nausea, vomiting, abdominal pain, diarrhea, constipation and anorexia.  Endocrine: Negative.  Negative for polydipsia, polyphagia and polyuria.  Genitourinary: Negative.  Negative for dysuria, urgency, frequency, hematuria, flank pain, vaginal discharge, pelvic pain and missed menses.  Musculoskeletal: Negative.  Negative for back pain and joint pain.  Skin: Negative.  Negative for rash.  Allergic/Immunologic: Negative.  Negative for immunocompromised state.  Neurological: Negative.  Negative for headaches.  Hematological: Negative.   Psychiatric/Behavioral: Negative.        Objective:   Physical Exam  Constitutional: She appears well-developed and well-nourished.  HENT:  Head: Normocephalic and atraumatic.  Right Ear: External ear normal.  Left Ear: External ear normal.  Nose: Nose normal.  Mouth/Throat: Oropharynx is clear and moist.  Eyes: Conjunctivae and EOM are normal. Pupils are equal, round, and reactive to light.  Neck: Normal range of motion. Neck supple.  Cardiovascular: Normal rate, regular rhythm, normal heart sounds and intact distal pulses.   Pulmonary/Chest: Effort normal and breath sounds normal.  Abdominal: Soft. Bowel sounds are normal.  Genitourinary: Rectum normal and uterus normal. No breast swelling, tenderness, discharge or bleeding. No labial fusion. There is no rash, tenderness, lesion or injury on the right labia. There is no rash, tenderness, lesion or injury on the left labia. No tenderness or bleeding in the vagina. No foreign body around the vagina. No vaginal discharge found.  Lymphadenopathy:    She has no cervical adenopathy.    She has no axillary adenopathy.  Neurological: She has normal strength. She displays a negative  Romberg sign.  Skin: Skin is warm, dry and intact.      BP 115/63 mmHg  Pulse 92  Temp(Src) 98.3 F (36.8 C)  (Oral)  Resp 14  Wt 154 lb (69.854 kg)  SpO2 100%  LMP 12/31/2015  Assessment & Plan:  1. Encounter for routine gynecological examination Recommend barrier protection with sexual intercourse.  Recommend monthly self-breast examination - Cytology - PAP [place] - Urinalysis Dipstick  Routine Health Maintenance:  Recommend a lowfat, low carbohydrate diet divided over 5-6 small meals, increase water intake to 6-8 glasses, and 150 minutes per week of cardiovascular exercise.  Patient is up to date with vaccinations Recommend yearly dental visit    RTC: Follow up in office as needed    Dorena Dew, FNP

## 2016-01-06 NOTE — Patient Instructions (Addendum)
Pap Test WHY AM I HAVING THIS TEST? A pap test is sometimes called a pap smear. It is a screening test that is used to check for signs of cancer of the vagina, cervix, and uterus. The test can also identify the presence of infection or precancerous changes. Your health care provider will likely recommend you have this test done on a regular basis. This test may be done: 1. Every 3 years, starting at age 21. 2. Every 5 years, in combination with testing for the presence of human papillomavirus (HPV). 3. More or less often depending on other medical conditions.  WHAT KIND OF SAMPLE IS TAKEN? Using a small cotton swab, plastic spatula, or brush, your health care provider will collect a sample of cells from the surface of your cervix. Your cervix is the opening to your uterus, also called a womb. Secretions from the cervix and vagina may also be collected. HOW DO I PREPARE FOR THE TEST?  Be aware of where you are in your menstrual cycle. You may be asked to reschedule the test if you are menstruating on the day of the test.  You may need to reschedule if you have a known vaginal infection on the day of the test.  You may be asked to avoid douching or taking a bath the day before or the day of the test.  Some medicines can cause abnormal test results, such as digitalis and tetracycline. Talk with your health care provider before your test if you take one of these medicines. WHAT DO THE RESULTS MEAN? Abnormal test results may indicate a number of health conditions. These may include:  Cancer. Although pap test results cannot be used to diagnose cancer of the cervix, vagina, or uterus, they may suggest the possibility of cancer. Further tests would be required to determine if cancer is present.  Sexually transmitted disease.  Fungal infection.  Parasite infection.  Herpes infection.  A condition causing or contributing to infertility. It is your responsibility to obtain your test results.  Ask the lab or department performing the test when and how you will get your results. Contact your health care provider to discuss any questions you have about your results.   This information is not intended to replace advice given to you by your health care provider. Make sure you discuss any questions you have with your health care provider.   Document Released: 10/10/2002 Document Revised: 08/10/2014 Document Reviewed: 12/11/2013 Elsevier Interactive Patient Education 2016 [employer] Breast Self-Awareness Practicing breast self-awareness may pick up problems early, prevent significant medical complications, and possibly save your life. By practicing breast self-awareness, you can become familiar with how your breasts look and feel and if your breasts are changing. This allows you to notice changes early. It can also offer you some reassurance that your breast health is good. One way to learn what is normal for your breasts and whether your breasts are changing is to do a breast self-exam. If you find a lump or something that was not present in the past, it is best to contact your caregiver right away. Other findings that should be evaluated by your caregiver include nipple discharge, especially if it is bloody; skin changes or reddening; areas where the skin seems to be pulled in (retracted); or new lumps and bumps. Breast pain is seldom associated with cancer (malignancy), but should also be evaluated by a caregiver. HOW TO PERFORM A BREAST SELF-EXAM The best time to examine your breasts is 5-7 days after   your menstrual period is over. During menstruation, the breasts are lumpier, and it may be more difficult to pick up changes. If you do not menstruate, have reached menopause, or had your uterus removed (hysterectomy), you should examine your breasts at regular intervals, such as monthly. If you are breastfeeding, examine your breasts after a feeding or after using a breast pump. Breast implants  do not decrease the risk for lumps or tumors, so continue to perform breast self-exams as recommended. Talk to your caregiver about how to determine the difference between the implant and breast tissue. Also, talk about the amount of pressure you should use during the exam. Over time, you will become more familiar with the variations of your breasts and more comfortable with the exam. A breast self-exam requires you to remove all your clothes above the waist. 4. Look at your breasts and nipples. Stand in front of a mirror in a room with good lighting. With your hands on your hips, push your hands firmly downward. Look for a difference in shape, contour, and size from one breast to the other (asymmetry). Asymmetry includes puckers, dips, or bumps. Also, look for skin changes, such as reddened or scaly areas on the breasts. Look for nipple changes, such as discharge, dimpling, repositioning, or redness. 5. Carefully feel your breasts. This is best done either in the shower or tub while using soapy water or when flat on your back. Place the arm (on the side of the breast you are examining) above your head. Use the pads (not the fingertips) of your three middle fingers on your opposite hand to feel your breasts. Start in the underarm area and use  inch (2 cm) overlapping circles to feel your breast. Use 3 different levels of pressure (light, medium, and firm pressure) at each circle before moving to the next circle. The light pressure is needed to feel the tissue closest to the skin. The medium pressure will help to feel breast tissue a little deeper, while the firm pressure is needed to feel the tissue close to the ribs. Continue the overlapping circles, moving downward over the breast until you feel your ribs below your breast. Then, move one finger-width towards the center of the body. Continue to use the  inch (2 cm) overlapping circles to feel your breast as you move slowly up toward the collar bone (clavicle)  near the base of the neck. Continue the up and down exam using all 3 pressures until you reach the middle of the chest. Do this with each breast, carefully feeling for lumps or changes. 6.  Keep a written record with breast changes or normal findings for each breast. By writing this information down, you do not need to depend only on memory for size, tenderness, or location. Write down where you are in your menstrual cycle, if you are still menstruating. Breast tissue can have some lumps or thick tissue. However, see your caregiver if you find anything that concerns you.  SEEK MEDICAL CARE IF:  You see a change in shape, contour, or size of your breasts or nipples.   You see skin changes, such as reddened or scaly areas on the breasts or nipples.   You have an unusual discharge from your nipples.   You feel a new lump or unusually thick areas.    This information is not intended to replace advice given to you by your health care provider. Make sure you discuss any questions you have with your health care provider.     Document Released: 07/20/2005 Document Revised: 07/06/2012 Document Reviewed: 11/04/2011 Elsevier Interactive Patient Education 2016 Elsevier Inc.  

## 2016-01-07 ENCOUNTER — Other Ambulatory Visit: Payer: Self-pay | Admitting: Family Medicine

## 2016-01-07 ENCOUNTER — Other Ambulatory Visit: Payer: Medicaid Other

## 2016-01-07 DIAGNOSIS — R8290 Unspecified abnormal findings in urine: Secondary | ICD-10-CM

## 2016-01-07 LAB — POCT URINALYSIS DIP (DEVICE)
BILIRUBIN URINE: NEGATIVE
Glucose, UA: NEGATIVE mg/dL
KETONES UR: NEGATIVE mg/dL
Nitrite: NEGATIVE
PH: 6.5 (ref 5.0–8.0)
Protein, ur: NEGATIVE mg/dL
Specific Gravity, Urine: 1.02 (ref 1.005–1.030)
Urobilinogen, UA: 0.2 mg/dL (ref 0.0–1.0)

## 2016-01-07 LAB — CYTOLOGY - PAP

## 2016-01-09 ENCOUNTER — Other Ambulatory Visit: Payer: Self-pay | Admitting: Family Medicine

## 2016-01-09 ENCOUNTER — Encounter: Payer: Self-pay | Admitting: Family Medicine

## 2016-01-09 DIAGNOSIS — B9689 Other specified bacterial agents as the cause of diseases classified elsewhere: Secondary | ICD-10-CM

## 2016-01-09 DIAGNOSIS — N760 Acute vaginitis: Principal | ICD-10-CM

## 2016-01-09 LAB — CERVICOVAGINAL ANCILLARY ONLY: CANDIDA VAGINITIS: NEGATIVE

## 2016-01-09 LAB — URINE CULTURE

## 2016-01-09 MED ORDER — METRONIDAZOLE 500 MG PO TABS: 500.0000 mg | ORAL_TABLET | Freq: Two times a day (BID) | ORAL | Status: DC

## 2016-01-09 NOTE — Progress Notes (Signed)
Called no answer, no way to leave message. Will try later. Also need to inform of negative pap results and this needs to be repeated in 3 years. Thanks!

## 2016-01-10 LAB — CERVICOVAGINAL ANCILLARY ONLY: HERPES (WINDOWPATH): NEGATIVE

## 2016-01-10 NOTE — Progress Notes (Signed)
Called, no answer and no way to leave voicemail. Will try later. Thanks!

## 2016-01-13 NOTE — Progress Notes (Signed)
Have tried to call multiple times, multiple numbers, no answer and no way to leave message. Will mail letter marked "confidential" advising of results. Thanks!

## 2016-01-14 ENCOUNTER — Encounter: Payer: Self-pay | Admitting: Family Medicine

## 2016-01-29 ENCOUNTER — Encounter: Payer: Self-pay | Admitting: Family Medicine

## 2016-06-28 ENCOUNTER — Encounter: Payer: Self-pay | Admitting: Family Medicine

## 2016-06-29 ENCOUNTER — Emergency Department (HOSPITAL_COMMUNITY)
Admission: EM | Admit: 2016-06-29 | Discharge: 2016-06-29 | Disposition: A | Discharge status: Home / Self Care | Payer: Medicaid Other | Attending: Emergency Medicine | Admitting: Emergency Medicine

## 2016-06-29 ENCOUNTER — Encounter (HOSPITAL_COMMUNITY): Payer: Self-pay | Admitting: *Deleted

## 2016-06-29 DIAGNOSIS — R1084 Generalized abdominal pain: Secondary | ICD-10-CM

## 2016-06-29 DIAGNOSIS — R112 Nausea with vomiting, unspecified: Secondary | ICD-10-CM

## 2016-06-29 LAB — COMPREHENSIVE METABOLIC PANEL
ALBUMIN: 3.8 g/dL (ref 3.5–5.0)
ALK PHOS: 68 U/L (ref 38–126)
ALT: 15 U/L (ref 14–54)
AST: 19 U/L (ref 15–41)
Anion gap: 9 (ref 5–15)
BUN: 13 mg/dL (ref 6–20)
CALCIUM: 9 mg/dL (ref 8.9–10.3)
CHLORIDE: 105 mmol/L (ref 101–111)
CO2: 24 mmol/L (ref 22–32)
CREATININE: 0.7 mg/dL (ref 0.44–1.00)
GFR calc Af Amer: >60 mL/min (ref >60)
GFR calc non Af Amer: >60 mL/min (ref >60)
GLUCOSE: 106 mg/dL — AB (ref 65–99)
Potassium: 3.4 mmol/L — ABNORMAL LOW (ref 3.5–5.1)
SODIUM: 138 mmol/L (ref 135–145)
Total Bilirubin: 0.4 mg/dL (ref 0.3–1.2)
Total Protein: 6.8 g/dL (ref 6.5–8.1)

## 2016-06-29 LAB — URINALYSIS, ROUTINE W REFLEX MICROSCOPIC
GLUCOSE, UA: NEGATIVE mg/dL
Ketones, ur: NEGATIVE mg/dL
Nitrite: POSITIVE — AB
Protein, ur: 100 mg/dL — AB
pH: 5.5 (ref 5.0–8.0)

## 2016-06-29 LAB — CBC
HCT: 38.3 % (ref 36.0–46.0)
HEMOGLOBIN: 12.9 g/dL (ref 12.0–15.0)
MCH: 31.8 pg (ref 26.0–34.0)
MCHC: 33.7 g/dL (ref 30.0–36.0)
MCV: 94.3 fL (ref 78.0–100.0)
PLATELETS: 229 10*3/uL (ref 150–400)
RBC: 4.06 MIL/uL (ref 3.87–5.11)
RDW: 12.8 % (ref 11.5–15.5)
WBC: 13 10*3/uL — ABNORMAL HIGH (ref 4.0–10.5)

## 2016-06-29 LAB — URINE MICROSCOPIC-ADD ON

## 2016-06-29 LAB — LIPASE, BLOOD: LIPASE: 21 U/L (ref 11–51)

## 2016-06-29 LAB — I-STAT BETA HCG BLOOD, ED (MC, WL, AP ONLY)

## 2016-06-29 MED ORDER — ACETAMINOPHEN 325 MG PO TABS
ORAL_TABLET | ORAL | Status: AC
  Filled 2016-06-29: qty 1

## 2016-06-29 MED ORDER — DICYCLOMINE HCL 20 MG PO TABS: 20.0000 mg | ORAL_TABLET | Freq: Two times a day (BID) | ORAL | 0 refills | Status: DC

## 2016-06-29 MED ORDER — ACETAMINOPHEN 325 MG PO TABS
325.0000 mg | ORAL_TABLET | Freq: Once | ORAL | Status: AC
  Administered 2016-06-29: 325 mg via ORAL

## 2016-06-29 MED ORDER — ONDANSETRON HCL 4 MG PO TABS: 4.0000 mg | ORAL_TABLET | Freq: Three times a day (TID) | ORAL | 0 refills | Status: DC | PRN

## 2016-06-29 NOTE — Discharge Instructions (Signed)
Abdominal (belly) pain can be caused by many things. Your caregiver performed an examination and possibly ordered blood/urine tests and imaging (CT scan, x-rays, ultrasound). Many cases can be observed and treated at home after initial evaluation in the emergency department. Even though you are being discharged home, abdominal pain can be unpredictable. Therefore, you need a repeated exam if your pain does not resolve, returns, or worsens. Most patients with abdominal pain don't have to be admitted to the hospital or have surgery, but serious problems like appendicitis and gallbladder attacks can start out as nonspecific pain. Many abdominal conditions cannot be diagnosed in one visit, so follow-up evaluations are very important.  Get help right away if:  You have chest pain.  You feel extremely weak or you faint.  You see blood in your vomit.  Your vomit looks like coffee grounds.  You have bloody or black stools or stools that look like tar.  You have a severe headache, a stiff neck, or both.  You have a rash.  You have severe pain, cramping, or bloating in your abdomen.  You have trouble breathing or you are breathing very quickly.  Your heart is beating very quickly.  Your skin feels cold and clammy.  You feel confused.  You have pain when you urinate.  You have signs of dehydration, such as: ? Dark urine, very little urine, or no urine. ? Cracked lips. ? Dry mouth. ? Sunken eyes. ? Sleepiness. ? Weakness.

## 2016-06-29 NOTE — ED Notes (Signed)
Ate bad chicken Friday night and vomited Saturday and Sunday. Patient tired peptobismal without relief. Patient states she's had some chills and generalized body aches. Low grade fever. Patient has not had her flu shot.

## 2016-06-29 NOTE — ED Provider Notes (Signed)
[facility]-EMERGENCY DEPT Provider Note   CSN: [id number] Arrival date & time: 06/29/16  1007     History   Chief Complaint Chief Complaint  Patient presents with  . Abdominal Pain    HPI Jillian Richardson is a 20 y.o. female presents with chief complaint of abdominal pain, nausea and vomiting. Patient states that her symptoms began suddenly Friday night. She had several episodes of nonbilious, nonbloody vomitus, but denies diarrhea. She had generalized crampy abdominal pain. All of these symptoms have resolved. The patient was noted to have an elevated temperature upon arrival. She denies any current abdominal pain or nausea. She denies any urinary symptoms, back pain. She states the last time she vomited was yesterday around 8 PM. She has not had any vomiting since that time. He did not take any medications prior to arrival. She denies symptoms of URI. Current temperature 99.1.  HPI  Past Medical History:  Diagnosis Date  . Allergic rhinitis 10/23/2013  . Allergy     Patient Active Problem List   Diagnosis Date Noted  . Annual physical exam 10/04/2015  . Depression 12/05/2013  . Costochondritis 12/05/2013  . Deliberate self-cutting 12/05/2013  . Obesity, unspecified 12/05/2013  . Allergic rhinitis 10/23/2013    History reviewed. No pertinent surgical history.  OB History    No data available       Home Medications    Prior to Admission medications   Medication Sig Start Date End Date Taking? Authorizing Provider  cetirizine (ZYRTEC) 10 MG tablet Take 10 mg by mouth daily.    Historical Provider, MD  dicyclomine (BENTYL) 20 MG tablet Take 1 tablet (20 mg total) by mouth 2 (two) times daily. 06/29/16   Arthor CaptainAbigail Deniece Rankin, PA-C  metroNIDAZOLE (FLAGYL) 500 MG tablet Take 1 tablet (500 mg total) by mouth 2 (two) times daily. Patient not taking: Reported on 06/29/2016 01/09/16   Massie MaroonLachina M Hollis, FNP  ondansetron (ZOFRAN) 4 MG tablet Take 1 tablet (4 mg total) by mouth every 8  (eight) hours as needed for nausea or vomiting. 06/29/16   Arthor CaptainAbigail Kervens Roper, PA-C    Family History No family history on file.  Social History Social History  Substance Use Topics  . Smoking status: Never Smoker  . Smokeless tobacco: Not on file  . Alcohol use No     Allergies   Patient has no known allergies.   Review of Systems Review of Systems Ten systems reviewed and are negative for acute change, except as noted in the HPI.    Physical Exam Updated Vital Signs BP 111/70 (BP Location: Right Arm)   Pulse 89   Temp 98.9 F (37.2 C) (Oral)   Resp 14   LMP 06/24/2016   SpO2 99%   Physical Exam Physical Exam  Nursing note and vitals reviewed. Constitutional: She is oriented to person, place, and time. She appears well-developed and well-nourished. No distress.  HENT:  Head: Normocephalic and atraumatic.  Eyes: Conjunctivae normal and EOM are normal. Pupils are equal, round, and reactive to light. No scleral icterus.  Neck: Normal range of motion.  Cardiovascular: Normal rate, regular rhythm and normal heart sounds.  Exam reveals no gallop and no friction rub.   No murmur heard. Pulmonary/Chest: Effort normal and breath sounds normal. No respiratory distress.  Abdominal: Soft. Bowel sounds are normal. She exhibits no distension and no mass. There is no tenderness. There is no guarding.  Neurological: She is alert and oriented to person, place, and time.  Skin: Skin is  warm and dry. She is not diaphoretic.     ED Treatments / Results  Labs (all labs ordered are listed, but only abnormal results are displayed) Labs Reviewed  COMPREHENSIVE METABOLIC PANEL - Abnormal; Notable for the following:       Result Value   Potassium 3.4 (*)    Glucose, Bld 106 (*)    All other components within normal limits  CBC - Abnormal; Notable for the following:    WBC 13.0 (*)    All other components within normal limits  LIPASE, BLOOD  URINALYSIS, ROUTINE W REFLEX MICROSCOPIC  (NOT AT [facility])  I-STAT BETA HCG BLOOD, ED ([facility], [facility], [facility] ONLY)    EKG  EKG Interpretation None       Radiology No results found.  Procedures Procedures (including critical care time)  Medications Ordered in ED Medications  acetaminophen (TYLENOL) 325 MG tablet (not administered)  acetaminophen (TYLENOL) tablet 325 mg (325 mg Oral Given 06/29/16 1052)     Initial Impression / Assessment and Plan / ED Course  I have reviewed the triage vital signs and the nursing notes.  Pertinent labs & imaging results that were available during my care of the patient were reviewed by me and considered in my medical decision making (see chart for details).  Clinical Course     Patient with benign abdominal examination. No tenderness whatsoever. She's had no vomiting here. Temperature is slower with Tylenol. I suspect a viral process. As the patient has a benign examination and labs are reassuring. I feel she may go home with strict return precautions. She'll be discharged with Zofran and Bentyl. She may take Tylenol or Motrin. I discussed reasons to seek immediate medical care including uncontrolled fever at home, above 100.4, localized abdominal pain, intractable nausea or vomiting, signs of dehydration, weakness, or syncope. Or any new or worsening symptoms of concern to the patient. She otherwise will follow-up with her primary care physician in next 1-2 days. Patient is safe for discharge at this time  Final Clinical Impressions(s) / ED Diagnoses   Final diagnoses:  Generalized abdominal pain  Nausea and vomiting, intractability of vomiting not specified, unspecified vomiting type    New Prescriptions New Prescriptions   DICYCLOMINE (BENTYL) 20 MG TABLET    Take 1 tablet (20 mg total) by mouth 2 (two) times daily.   ONDANSETRON (ZOFRAN) 4 MG TABLET    Take 1 tablet (4 mg total) by mouth every 8 (eight) hours as needed for nausea or vomiting.     Arthor Captainbigail Fionnuala Hemmerich, PA-C 06/29/16 1255      Derwood KaplanAnkit Nanavati, MD 07/04/16 1146

## 2016-06-29 NOTE — ED Triage Notes (Signed)
Pt reports abdominal pain, N/V since Friday. Pt states that she thinks she ate some "bad chicken"

## 2016-07-03 ENCOUNTER — Encounter: Payer: Self-pay | Admitting: Family Medicine

## 2016-07-03 ENCOUNTER — Ambulatory Visit (INDEPENDENT_AMBULATORY_CARE_PROVIDER_SITE_OTHER): Payer: Self-pay | Admitting: Family Medicine

## 2016-07-03 VITALS — BP 133/64 | HR 97 | Temp 98.8°F | Resp 14 | Ht 59.0 in | Wt 160.0 lb

## 2016-07-03 DIAGNOSIS — J069 Acute upper respiratory infection, unspecified: Secondary | ICD-10-CM

## 2016-07-03 NOTE — Patient Instructions (Signed)
Drink lots of liquids. OTC products for symptomatic relief Follow-up is symptoms worse after next Tuesday or are not about gone in 2 weeks.

## 2016-07-03 NOTE — Progress Notes (Signed)
Jillian Richardson, is a 20 y.o. female  AVW:[id number]  [id number]  DOB - 02-09-1996  CC:  Chief Complaint  Patient presents with  . Sore Throat       HPI: Jillian Richardson is a 20 y.o. female presents with a complaint of ST and cough. She was seen in ED on Monday and treated for viral gastroenteritis (now resolved). She was also prescribed Macrobid for UTI.  Later that day she developed a ST and over the next couple of days a  Cough. She has a low grade fever in ED on Monday. She denies ear pain. She does endorse minor nasal congestion. She denies fever since Monday and no chills. No sinus pressure or headache. Fatigue is present.   No Known Allergies Past Medical History:  Diagnosis Date  . Allergic rhinitis 10/23/2013  . Allergy    Current Outpatient Prescriptions on File Prior to Visit  Medication Sig Dispense Refill  . cetirizine (ZYRTEC) 10 MG tablet Take 10 mg by mouth daily.    Marland Kitchen. dicyclomine (BENTYL) 20 MG tablet Take 1 tablet (20 mg total) by mouth 2 (two) times daily. (Patient not taking: Reported on 07/03/2016) 20 tablet 0  . metroNIDAZOLE (FLAGYL) 500 MG tablet Take 1 tablet (500 mg total) by mouth 2 (two) times daily. (Patient not taking: Reported on 07/03/2016) 14 tablet 0  . ondansetron (ZOFRAN) 4 MG tablet Take 1 tablet (4 mg total) by mouth every 8 (eight) hours as needed for nausea or vomiting. (Patient not taking: Reported on 07/03/2016) 10 tablet 0   No current facility-administered medications on file prior to visit.    History reviewed. No pertinent family history. Social History   Social History  . Marital status: Single    Spouse name: N/A  . Number of children: N/A  . Years of education: N/A   Occupational History  . Not on file.   Social History Main Topics  . Smoking status: Never Smoker  . Smokeless tobacco: Never Used  . Alcohol use No  . Drug use: No  . Sexual activity: Yes    Birth control/ protection: None   Other Topics Concern   . Not on file   Social History Narrative  . No narrative on file    Review of Systems: See HPI.  Objective:   Vitals:   07/03/16 1536  BP: 133/64  Pulse: 97  Resp: 14  Temp: 98.8 F (37.1 C)    Physical Exam: Constitutional: Patient appears well-developed and well-nourished. No distress. HENT: Normocephalic, atraumatic, External right and left ear normal. Oropharynx is clear and moist. Minor nasal congestion Eyes: Conjunctivae and EOM are normal. PERRLA, no scleral icterus. Neck: Normal ROM. Neck supple. No lymphadenopathy, No thyromegaly. CVS: RRR, S1/S2 +, no murmurs, no gallops, no rubs Pulmonary: Effort and breath sounds normal, no stridor, rhonchi, wheezes, rales.  Abdominal: Soft. Normoactive BS,, no distension, tenderness, rebound or guarding.   Neuro: Alert Skin: Skin is warm and dry. No rash noted. Not diaphoretic. No erythema. No pallor. Psychiatric: Normal mood and affect.   Lab Results  Component Value Date   WBC 13.0 (H) 06/29/2016   HGB 12.9 06/29/2016   HCT 38.3 06/29/2016   MCV 94.3 06/29/2016   PLT 229 06/29/2016   Lab Results  Component Value Date   CREATININE 0.70 06/29/2016   BUN 13 06/29/2016   NA 138 06/29/2016   K 3.4 (L) 06/29/2016   CL 105 06/29/2016   CO2 24 06/29/2016    Lab  Results  Component Value Date   HGBA1C 4.9 10/04/2015   Lipid Panel     Component Value Date/Time   CHOL 191 (H) 10/11/2015 0955   TRIG 141 10/11/2015 0955   HDL 70 10/11/2015 0955   CHOLHDL 2.7 10/11/2015 0955   VLDL 28 10/11/2015 0955   LDLCALC 93 10/11/2015 0955        Assessment and plan:   1. Acute upper respiratory infection -OTC symptomatic measures -lots of liquids.   Return if symptoms worsen or fail to improve.  The patient was given clear instructions to go to ER or return to medical center if symptoms don't improve, worsen or new problems develop. The patient verbalized understanding.    Henrietta HooverLinda C Eria Lozoya FNP  07/03/2016, 3:56  PM

## 2016-09-12 ENCOUNTER — Encounter (HOSPITAL_COMMUNITY): Payer: Self-pay

## 2016-09-12 ENCOUNTER — Emergency Department (HOSPITAL_COMMUNITY)
Admission: EM | Admit: 2016-09-12 | Discharge: 2016-09-12 | Disposition: A | Discharge status: Home / Self Care | Payer: Medicaid Other | Attending: Emergency Medicine | Admitting: Emergency Medicine

## 2016-09-12 DIAGNOSIS — Z79899 Other long term (current) drug therapy: Secondary | ICD-10-CM | POA: Insufficient documentation

## 2016-09-12 DIAGNOSIS — N390 Urinary tract infection, site not specified: Secondary | ICD-10-CM | POA: Insufficient documentation

## 2016-09-12 LAB — URINALYSIS, ROUTINE W REFLEX MICROSCOPIC
Bilirubin Urine: NEGATIVE
Glucose, UA: NEGATIVE mg/dL
KETONES UR: NEGATIVE mg/dL
Nitrite: NEGATIVE
PROTEIN: NEGATIVE mg/dL
Specific Gravity, Urine: 1.006 (ref 1.005–1.030)
pH: 6 (ref 5.0–8.0)

## 2016-09-12 LAB — POC URINE PREG, ED: PREG TEST UR: NEGATIVE

## 2016-09-12 MED ORDER — SULFAMETHOXAZOLE-TRIMETHOPRIM 800-160 MG PO TABS: 1.0000 | ORAL_TABLET | Freq: Two times a day (BID) | ORAL | 0 refills | Status: AC

## 2016-09-12 NOTE — ED Triage Notes (Signed)
Pt. Reports having urinary frequency and dysuria.  Pt. reports the symptoms began 1 week ago. Pt. Is using OTC medication without any relief.  Pt. Denies any vaginal pain or discharge.

## 2016-09-12 NOTE — ED Notes (Signed)
Declined W/C at D/C and was escorted to lobby by RN. 

## 2016-09-12 NOTE — ED Provider Notes (Signed)
[facility]-EMERGENCY DEPT Provider Note   CSN: [id number] Arrival date & time: 09/12/16  [id number]  By signing my name below, I, Jillian Richardson, attest that this documentation has been prepared under the direction and in the presence of Fayrene HelperBowie Tejay Hubert, PA-C. Electronically Signed: Orpah CobbMaurice Richardson , ED Scribe. 09/12/16. 11:12 AM.     History   Chief Complaint Chief Complaint  Patient presents with  . Urinary Frequency    HPI  Jillian Richardson is a 21 y.o. female who presents to the Emergency Department complaining of mild to moderate urinary frequency and dysuria with sudden onset x1 week. Pt states that she suspects she has a UTI due to intermittent episodes of dysuria, urgency and frequency. Pt has reportedly taken OTC medication for UTI and Ibuprofen for the pain with no relief. She denies fever, vomiting, diarrhea, vaginal bleeding/ discharge, back pain, rash and known allergies to medications. Of note, pt's last menstrual was Jan. 21st.  The history is provided by the patient. No language interpreter was used.  Urinary Frequency     Past Medical History:  Diagnosis Date  . Allergic rhinitis 10/23/2013  . Allergy     Patient Active Problem List   Diagnosis Date Noted  . Annual physical exam 10/04/2015  . Depression 12/05/2013  . Costochondritis 12/05/2013  . Deliberate self-cutting 12/05/2013  . Obesity, unspecified 12/05/2013  . Allergic rhinitis 10/23/2013    History reviewed. No pertinent surgical history.  OB History    No data available       Home Medications    Prior to Admission medications   Medication Sig Start Date End Date Taking? Authorizing Provider  cetirizine (ZYRTEC) 10 MG tablet Take 10 mg by mouth daily.    Historical Provider, MD  dicyclomine (BENTYL) 20 MG tablet Take 1 tablet (20 mg total) by mouth 2 (two) times daily. Patient not taking: Reported on 07/03/2016 06/29/16   Arthor CaptainAbigail Harris, PA-C  metroNIDAZOLE (FLAGYL) 500 MG tablet Take 1 tablet (500  mg total) by mouth 2 (two) times daily. Patient not taking: Reported on 07/03/2016 01/09/16   Massie MaroonLachina M Hollis, FNP  ondansetron (ZOFRAN) 4 MG tablet Take 1 tablet (4 mg total) by mouth every 8 (eight) hours as needed for nausea or vomiting. Patient not taking: Reported on 07/03/2016 06/29/16   Arthor CaptainAbigail Harris, PA-C    Family History No family history on file.  Social History Social History  Substance Use Topics  . Smoking status: Never Smoker  . Smokeless tobacco: Never Used  . Alcohol use No     Allergies   Patient has no known allergies.   Review of Systems Review of Systems  Constitutional: Negative for fever.  Gastrointestinal: Negative for diarrhea and vomiting.  Genitourinary: Positive for decreased urine volume, dysuria, frequency and urgency. Negative for vaginal bleeding and vaginal discharge.  Musculoskeletal: Negative for back pain.  Skin: Negative for rash.     Physical Exam Updated Vital Signs BP 132/78 (BP Location: Right Arm)   Pulse 87   Temp 98.4 F (36.9 C) (Oral)   Resp 18   Ht 4\' 11"  (1.499 m)   Wt 160 lb (72.6 kg)   LMP 08/23/2016   SpO2 100%   BMI 32.32 kg/m   Physical Exam  Constitutional: She appears well-developed and well-nourished. No distress.  HENT:  Head: Normocephalic and atraumatic.  Eyes: Conjunctivae are normal.  Neck: Neck supple.  Cardiovascular: Normal rate, regular rhythm and normal heart sounds.   No murmur heard. Pulmonary/Chest: Effort normal and  breath sounds normal. No respiratory distress.  Abdominal: Soft. There is no tenderness. There is no CVA tenderness.  Musculoskeletal: She exhibits no edema.  Neurological: She is alert.  Skin: Skin is warm and dry.  Psychiatric: She has a normal mood and affect.  Nursing note and vitals reviewed.    ED Treatments / Results   DIAGNOSTIC STUDIES: Oxygen Saturation is 100% on RA, normal by my interpretation.   COORDINATION OF CARE: 11:15 AM-Discussed next steps with  pt. Pt verbalized understanding and is agreeable with the plan.    Labs (all labs ordered are listed, but only abnormal results are displayed) Labs Reviewed  URINALYSIS, ROUTINE W REFLEX MICROSCOPIC - Abnormal; Notable for the following:       Result Value   Color, Urine STRAW (*)    APPearance HAZY (*)    Hgb urine dipstick SMALL (*)    Leukocytes, UA LARGE (*)    Bacteria, UA RARE (*)    Squamous Epithelial / LPF 0-5 (*)    All other components within normal limits  POC URINE PREG, ED    EKG  EKG Interpretation None       Radiology No results found.  Procedures Procedures (including critical care time)  Medications Ordered in ED Medications - No data to display   Initial Impression / Assessment and Plan / ED Course  I have reviewed the triage vital signs and the nursing notes.  Pertinent labs & imaging results that were available during my care of the patient were reviewed by me and considered in my medical decision making (see chart for details).     Upon urinalysis, pt diagnosed with a UTI. Pt is afebrile, tachycardia, hypotension, or other signs of serious infection.  Pt to be dc home with antibiotics and instructions to follow up with PCP if symptoms persist. Discussed return precautions. Pt appears safe for discharge.   Final Clinical Impressions(s) / ED Diagnoses   Final diagnoses:  Lower urinary tract infectious disease    New Prescriptions Discharge Medication List as of 09/12/2016 11:22 AM    START taking these medications   Details  sulfamethoxazole-trimethoprim (BACTRIM DS,SEPTRA DS) 800-160 MG tablet Take 1 tablet by mouth 2 (two) times daily., Starting Sat 09/12/2016, Until Sat 09/19/2016, Print      I personally performed the services described in this documentation, which was scribed in my presence. The recorded information has been reviewed and is accurate.       Fayrene Helper, PA-C 09/12/16 1143    Mancel Bale, MD 09/12/16 [id number]

## 2016-09-18 ENCOUNTER — Ambulatory Visit: Payer: Medicaid Other | Admitting: Family Medicine

## 2016-09-25 ENCOUNTER — Ambulatory Visit: Payer: Medicaid Other | Admitting: Family Medicine

## 2016-12-22 ENCOUNTER — Ambulatory Visit: Payer: Medicaid Other | Admitting: Family Medicine

## 2016-12-25 ENCOUNTER — Ambulatory Visit (INDEPENDENT_AMBULATORY_CARE_PROVIDER_SITE_OTHER): Payer: Self-pay | Admitting: Family Medicine

## 2016-12-25 ENCOUNTER — Encounter: Payer: Self-pay | Admitting: Family Medicine

## 2016-12-25 VITALS — BP 118/60 | HR 81 | Temp 99.1°F | Resp 16 | Ht 59.0 in | Wt 166.0 lb

## 2016-12-25 DIAGNOSIS — M25531 Pain in right wrist: Secondary | ICD-10-CM

## 2016-12-25 DIAGNOSIS — S6991XS Unspecified injury of right wrist, hand and finger(s), sequela: Secondary | ICD-10-CM

## 2016-12-25 DIAGNOSIS — S63501S Unspecified sprain of right wrist, sequela: Secondary | ICD-10-CM
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
## 2016-12-25 NOTE — Patient Instructions (Addendum)
Continue to wear right wrist brace.  Voltaren 25 mg twice daily as needed.  Apply warm, moist compresses to right wrist. Refrain from lifting over 20 pounds.  Follow up around January 18, 2017 Wrist Sprain, Adult A wrist sprain is a stretch or tear in the strong, fibrous tissues (ligaments) that connect your wrist bones. There are three types of wrist sprains:  Grade 1. In this type of sprain, the ligament is stretched more than normal.  Grade 2. In this type of sprain, the ligament is partially torn. You may be able to move your wrist, but not very much.  Grade 3. In this type of sprain, the ligament or muscle is completely torn. You may find it difficult or extremely painful to move your wrist even a little. What are the causes? A wrist sprain can be caused by using the wrist too much during sports, exercise, or at work. It can also happen with a fall or during an accident. What increases the risk? This condition is more likely to occur in people:  With a previous wrist or arm injury.  With poor wrist strength and flexibility.  Who play contact sports, such as football or soccer.  Who play sports that may result in a fall, such as skateboarding, biking, skiing, or snowboarding.  Who do not exercise regularly.  Who use exercise equipment that does not fit well. What are the signs or symptoms? Symptoms of this condition include:  Pain in the wrist, arm, or hand.  Swelling or bruised skin near the wrist, hand, or arm. The skin may look yellow or kind of blue.  Stiffness or trouble moving the hand.  Hearing a pop or feeling a tear at the time of the injury.  A warm feeling in the skin around the wrist. How is this diagnosed? This condition is diagnosed with a physical exam. Sometimes an X-ray is taken to make sure a bone did not break. If your health care provider thinks that you tore a ligament, he or she may order an MRI of your wrist. How is this treated? This condition is  treated by resting and applying ice to your wrist. Additional treatment may include:  Medicine for pain and inflammation.  A splint to keep your wrist still (immobilized).  Exercises to strengthen and stretch your wrist.  Surgery. This may be done if the ligament is completely torn. Follow these instructions at home: If you have a splint:    Do not put pressure on any part of the splint until it is fully hardened. This may take several hours.  Wear the splint as told by your health care provider. Remove it only as told by your health care provider.  Loosen the splint if your fingers tingle, become numb, or turn cold and blue.  If your splint is not waterproof:  Do not let it get wet.  Cover it with a watertight covering when you take a bath or a shower.  Keep the splint clean. Managing pain, stiffness, and swelling    If directed, put ice on the injured area.  If you have a removable splint, remove it as told by your health care provider.  Put ice in a plastic bag.  Place a towel between your skin and the bag or between the splint and the bag.  Leave the ice on for 20 minutes, 2-3 times per day.  Move your fingers often to avoid stiffness and to lessen swelling.  Raise (elevate) the injured area above  the level of your heart while you are sitting or lying down. Activity   Rest your wrist. Do not do things that cause pain.  Return to your normal activities as told by your health care provider. Ask your health care provider what activities are safe for you.  Do exercises as told by your health care provider. General instructions   Take over-the-counter and prescription medicines only as told by your health care provider.  Do not use any products that contain nicotine or tobacco, such as cigarettes and e-cigarettes. These can delay healing. If you need help quitting, ask your health care provider.  Ask your health care provider when it is safe to drive if you have  a splint.  Keep all follow-up visits as told by your health care provider. This is important. Contact a health care provider if:  Your pain, bruising, or swelling gets worse.  Your skin becomes red, gets a rash, or has open sores.  Your pain does not get better or it gets worse. Get help right away if:  You have a new or sudden sharp pain in the hand, arm, or wrist.  You have tingling or numbness in your hand.  Your fingers turn white, very red, or cold and blue.  You cannot move your fingers. This information is not intended to replace advice given to you by your health care provider. Make sure you discuss any questions you have with your health care provider. Document Released: 03/23/2014 Document Revised: 02/15/2016 Document Reviewed: 02/06/2016 Elsevier Interactive Patient Education  2017 [employer]

## 2017-01-13 ENCOUNTER — Emergency Department (HOSPITAL_COMMUNITY)
Admission: EM | Admit: 2017-01-13 | Discharge: 2017-01-13 | Disposition: A | Discharge status: Home / Self Care | Payer: Medicaid Other | Attending: Emergency Medicine | Admitting: Emergency Medicine

## 2017-01-13 ENCOUNTER — Encounter (HOSPITAL_COMMUNITY): Payer: Self-pay | Admitting: Emergency Medicine

## 2017-01-13 DIAGNOSIS — Z79899 Other long term (current) drug therapy: Secondary | ICD-10-CM | POA: Insufficient documentation

## 2017-01-13 DIAGNOSIS — R51 Headache: Secondary | ICD-10-CM | POA: Insufficient documentation

## 2017-01-13 DIAGNOSIS — K0889 Other specified disorders of teeth and supporting structures: Secondary | ICD-10-CM | POA: Insufficient documentation

## 2017-01-13 MED ORDER — NAPROXEN 500 MG PO TABS: 500.0000 mg | ORAL_TABLET | Freq: Two times a day (BID) | ORAL | 0 refills | Status: DC

## 2017-01-13 MED ORDER — PENICILLIN V POTASSIUM 500 MG PO TABS: 500.0000 mg | ORAL_TABLET | Freq: Two times a day (BID) | ORAL | 0 refills | Status: DC

## 2017-01-13 NOTE — ED Triage Notes (Addendum)
Pt from home with c/o dental pain that she can not locate the specific location for. Pt states it "is mostly on the bottom jaw but she thinks its on the left side and right side". This pain has been ongoing x 2 weeks. Pt states she doesn't have a DDS. Pt states this has caused her to have a headache x 2 days. Pt denies nausea, emesis, or sensitivity to light.

## 2017-01-13 NOTE — ED Provider Notes (Signed)
[facility]-EMERGENCY DEPT Provider Note   CSN: [id number] Arrival date & time: 01/13/17  2159     History   Chief Complaint Chief Complaint  Patient presents with  . Dental Pain    HPI Jillian Richardson is a 21 y.o. female.  HPI   Pt is a 21 yo female with no pertinent PMH who presents to the ED With complaints of dental pain to bilateral lower wisdom teeth. Patient reports having intermittent pain for the past few months but worsened a few days ago. Reports pain has worsened and caused her to have a mild headache. Reports taking Motrin at home with mild intermittent relief. Denies fever, lightheadedness, dizziness, visual changes, facial or neck swelling, neck stiffness, trismus, drooling, difficulty swallowing, difficulty breathing, vomiting, drainage. Patient reports she currently does not have a dentist and denies having dental insurance.  Past Medical History:  Diagnosis Date  . Allergic rhinitis 10/23/2013  . Allergy     Patient Active Problem List   Diagnosis Date Noted  . Annual physical exam 10/04/2015  . Depression 12/05/2013  . Costochondritis 12/05/2013  . Deliberate self-cutting 12/05/2013  . Obesity, unspecified 12/05/2013  . Allergic rhinitis 10/23/2013    History reviewed. No pertinent surgical history.  OB History    No data available       Home Medications    Prior to Admission medications   Medication Sig Start Date End Date Taking? Authorizing Provider  diclofenac (VOLTAREN) 25 MG EC tablet Take 25 mg by mouth 3 (three) times daily.   Yes [provider]  cetirizine (ZYRTEC) 10 MG tablet Take 10 mg by mouth daily.    [provider]  naproxen (NAPROSYN) 500 MG tablet Take 1 tablet (500 mg total) by mouth 2 (two) times daily. 01/13/17   Barrett Henle, PA-C  penicillin v potassium (VEETID) 500 MG tablet Take 1 tablet (500 mg total) by mouth 2 (two) times daily. 01/13/17   Barrett Henle, PA-C    Family History No  family history on file.  Social History Social History  Substance Use Topics  . Smoking status: Never Smoker  . Smokeless tobacco: Never Used  . Alcohol use No     Allergies   Patient has no known allergies.   Review of Systems Review of Systems  HENT: Positive for dental problem.   All other systems reviewed and are negative.    Physical Exam Updated Vital Signs BP 122/73 (BP Location: Left Arm)   Pulse 89   Temp 98.1 F (36.7 C) (Oral)   Resp 16   Ht 4\' 11"  (1.499 m)   Wt 75.3 kg (166 lb)   LMP 12/27/2016   SpO2 99%   BMI 33.53 kg/m   Physical Exam  Constitutional: She is oriented to person, place, and time. She appears well-developed and well-nourished.  HENT:  Head: Normocephalic and atraumatic.  Mouth/Throat: Uvula is midline, oropharynx is clear and moist and mucous membranes are normal. No oral lesions. No trismus in the jaw. Abnormal dentition. No dental abscesses or uvula swelling. No oropharyngeal exudate, posterior oropharyngeal edema, posterior oropharyngeal erythema or tonsillar abscesses. No tonsillar exudate.  Impaction present to bilateral lower wisdom teeth with mild swelling and erythema present to surrounding gingiva. No induration, fluctuance or drainage present. No trismus, drooling, facial/neck swelling or stridor on exam. No muffled voice. Floor of mouth soft.  No facial or neck swelling, patient tolerating secretions.  Eyes: Conjunctivae and EOM are normal. Right eye exhibits no discharge. Left  eye exhibits no discharge. No scleral icterus.  Neck: Normal range of motion. Neck supple.  Cardiovascular: Normal rate.   Pulmonary/Chest: Effort normal.  Musculoskeletal: Normal range of motion.  Lymphadenopathy:    She has no cervical adenopathy.  Neurological: She is alert and oriented to person, place, and time.  Skin: Skin is warm and dry. Capillary refill takes less than 2 seconds.  Nursing note and vitals reviewed.    ED Treatments /  Results  Labs (all labs ordered are listed, but only abnormal results are displayed) Labs Reviewed - No data to display  EKG  EKG Interpretation None       Radiology No results found.  Procedures Procedures (including critical care time)  Medications Ordered in ED Medications - No data to display   Initial Impression / Assessment and Plan / ED Course  I have reviewed the triage vital signs and the nursing notes.  Pertinent labs & imaging results that were available during my care of the patient were reviewed by me and considered in my medical decision making (see chart for details).     Patient with toothache d/t impacted lower wisdom teeth.  No gross abscess.  Exam unconcerning for Ludwig's angina or spread of infection.  Will treat with penicillin and pain medicine.  Urged patient to follow-up with dentist.   Patient given outpatient dental resources.  Final Clinical Impressions(s) / ED Diagnoses   Final diagnoses:  Pain, dental    New Prescriptions New Prescriptions   NAPROXEN (NAPROSYN) 500 MG TABLET    Take 1 tablet (500 mg total) by mouth 2 (two) times daily.   PENICILLIN V POTASSIUM (VEETID) 500 MG TABLET    Take 1 tablet (500 mg total) by mouth 2 (two) times daily.     Barrett Henleadeau, Jimya Ciani Elizabeth, PA-C 01/13/17 2237    Lorre NickAllen, Anthony, MD 01/14/17 [phone]

## 2017-01-13 NOTE — Discharge Instructions (Signed)
Take medications as prescribed. You may also take 600 mg ibuprofen 4 times daily as needed for pain relief. I recommend eating prior to taking ibuprofen to prevent gastrointestinal side effects. You may apply ice to affected area for 15-20 minutes 3-4 times daily to help with pain. °Follow-up with one of the dental clinics listed below for further management of your dental pain. °Return to the emergency department if symptoms worsen or new onset of fever, headache, neck stiffness, facial/neck swelling, unable to open jaw, unable to swallow resulting in drooling, difficulty breathing, drainage, unable to tolerate fluids.  ° °[employer] °[employer] °[facility] °[address] °[place], [place] [address] °Phone [phone] ° °[facility] [employer] [facility] in [place], [place], exemplifies the Dental School?s vision to improve the health and quality of life of all [place] by creating leaders with a passion to care for the underserved and by leading the nation in community-based, service learning oral health education. ° °We are committed to offering comprehensive general dental services for adults, children and special needs patients in a safe, caring and professional setting. ° ° °Appointments: Our clinic is open Monday through Friday 8:00 a.m. until 5:00 p.m. The amount of time scheduled for an appointment depends on the patient?s specific needs. We ask that you keep your appointed time for care or provide 24-hour notice of all appointment changes. Parents or legal guardians must accompany minor children. °  °Payment for Services: Medicaid and other insurance plans are welcome. Payment for services is due when services are rendered and may be made by cash or credit card. If you have dental insurance, we will assist you with your claim submission. °   °Emergencies:   Emergency services will be provided Monday through Friday on a walk-in basis.  Please arrive early for emergency services. After hours emergency services will be provided for patients of record as required. °  °Services:  °Comprehensive General Dentistry °Children?s Dentistry °Oral Surgery - Extractions °Root Canals °Sealants and Tooth Colored Fillings °Crowns and Bridges °Dentures and Partial Dentures °Implant Services °Periodontal Services and Cleanings °Cosmetic Tooth Whitening °Digital Radiography °3-D/[facility]  °

## 2017-01-18 ENCOUNTER — Encounter: Payer: Self-pay | Admitting: Family Medicine

## 2017-01-18 ENCOUNTER — Other Ambulatory Visit: Payer: Self-pay | Admitting: Family Medicine

## 2017-01-18 ENCOUNTER — Ambulatory Visit (INDEPENDENT_AMBULATORY_CARE_PROVIDER_SITE_OTHER): Payer: Self-pay | Admitting: Family Medicine

## 2017-01-18 VITALS — BP 109/54 | HR 88 | Temp 98.8°F | Resp 14 | Ht 59.0 in | Wt 170.0 lb

## 2017-01-18 DIAGNOSIS — M25531 Pain in right wrist: Secondary | ICD-10-CM

## 2017-01-18 DIAGNOSIS — Z6834 Body mass index (BMI) 34.0-34.9, adult: Secondary | ICD-10-CM

## 2017-01-18 DIAGNOSIS — S63501D Unspecified sprain of right wrist, subsequent encounter: Secondary | ICD-10-CM

## 2017-01-18 DIAGNOSIS — S63501A Unspecified sprain of right wrist, initial encounter: Secondary | ICD-10-CM | POA: Insufficient documentation

## 2017-01-18 DIAGNOSIS — R635 Abnormal weight gain: Secondary | ICD-10-CM

## 2017-01-18 DIAGNOSIS — R5383 Other fatigue: Secondary | ICD-10-CM

## 2017-01-18 DIAGNOSIS — Z114 Encounter for screening for human immunodeficiency virus [HIV]: Secondary | ICD-10-CM

## 2017-01-18 DIAGNOSIS — K0889 Other specified disorders of teeth and supporting structures: Secondary | ICD-10-CM

## 2017-01-18 DIAGNOSIS — Z118 Encounter for screening for other infectious and parasitic diseases: Secondary | ICD-10-CM

## 2017-01-18 LAB — TSH: TSH: 2.97 m[IU]/L

## 2017-01-18 LAB — COMPLETE METABOLIC PANEL WITH GFR
ALBUMIN: 4.2 g/dL (ref 3.6–5.1)
ALK PHOS: 84 U/L (ref 33–115)
ALT: 12 U/L (ref 6–29)
AST: 13 U/L (ref 10–30)
BILIRUBIN TOTAL: 0.3 mg/dL (ref 0.2–1.2)
BUN: 11 mg/dL (ref 7–25)
CO2: 25 mmol/L (ref 20–31)
CREATININE: 0.62 mg/dL (ref 0.50–1.10)
Calcium: 9.2 mg/dL (ref 8.6–10.2)
Chloride: 101 mmol/L (ref 98–110)
GFR, Est African American: >89 mL/min (ref >=60)
GLUCOSE: 84 mg/dL (ref 65–99)
Potassium: 4 mmol/L (ref 3.5–5.3)
SODIUM: 137 mmol/L (ref 135–146)
TOTAL PROTEIN: 7.1 g/dL (ref 6.1–8.1)
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
## 2017-01-18 NOTE — Patient Instructions (Addendum)
Wear compression bandage to wrist over the next 4 weeks.  Continue to apply warm, moist compresses to right wrist. Use cool compresses interchangeably 20 minutes 4 times per day.  Refrain from lifting greater than 20 pounds over the next 4 weeks. Strain sprain to right wrist.      Jillian DallasErinrico Silva [phone] (Schedule dentist appointment)    Weight gain:  Recommend a lowfat, low carbohydrate diet divided over 5-6 small meals, increase water intake to 6-8 glasses, and 150 minutes per week of cardiovascular exercise.   Breakf

## 2017-01-19 ENCOUNTER — Telehealth: Payer: Self-pay

## 2017-01-19 LAB — HIV ANTIBODY (ROUTINE TESTING W REFLEX): HIV 1&2 Ab, 4th Generation: NONREACTIVE

## 2017-01-19 LAB — GC/CHLAMYDIA PROBE AMP
CT Probe RNA: NOT DETECTED
GC Probe RNA: NOT DETECTED

## 2017-01-19 NOTE — Telephone Encounter (Signed)
-----   Message from Massie MaroonLachina M Hollis, [place] sent at 01/19/2017  8:16 AM EDT ----- Regarding: lab results Please inform patient that all laboratory values are within a normal range. I suspect that weight gain is due to excess calorie intake. Recommend a low fat, low carbohydrate diet divided over 5-6 small meals. Also, exercises 30 minutes 5 days per week as discussed during appointment. Follow up in 6 months as scheduled.   Thanks ----- Message ----- From: Interface, Lab In Three [facility] Five Sent: 01/19/2017   5:47 AM To: Massie MaroonLachina M Hollis, FNP

## 2017-01-19 NOTE — Telephone Encounter (Signed)
Called and spoke with patient. Advised of all labs normal and that weight gain is most likely due to excess calorie intake. Advised patient to eat a low carb diet over 5 to 6 small meals daily and exercise 30 minutes 5 days a week. Advised to keep next scheduled appointment in December and to call if any problems arise before then. Thanks!

## 2017-01-20 LAB — HEMOGLOBIN A1C
Hgb A1c MFr Bld: 4.8 % (ref <5.7)
MEAN PLASMA GLUCOSE: 91 mg/dL

## 2017-02-07 ENCOUNTER — Encounter: Payer: Self-pay | Admitting: Family Medicine

## 2017-02-09 ENCOUNTER — Encounter: Payer: Self-pay | Admitting: Family Medicine

## 2017-02-18 ENCOUNTER — Encounter: Payer: Self-pay | Admitting: Family Medicine

## 2017-02-19 ENCOUNTER — Ambulatory Visit: Payer: Medicaid Other | Admitting: Family Medicine

## 2017-02-23 ENCOUNTER — Encounter: Payer: Self-pay | Admitting: Family Medicine

## 2017-02-23 ENCOUNTER — Other Ambulatory Visit (HOSPITAL_COMMUNITY)
Admission: RE | Admit: 2017-02-23 | Discharge: 2017-02-23 | Disposition: A | Discharge status: Home / Self Care | Payer: Medicaid Other | Source: Ambulatory Visit | Attending: Family Medicine | Admitting: Family Medicine

## 2017-02-23 ENCOUNTER — Ambulatory Visit (INDEPENDENT_AMBULATORY_CARE_PROVIDER_SITE_OTHER): Payer: Self-pay | Admitting: Family Medicine

## 2017-02-23 VITALS — BP 124/57 | HR 84 | Temp 98.7°F | Resp 14 | Ht 59.0 in | Wt 162.0 lb

## 2017-02-23 DIAGNOSIS — B9689 Other specified bacterial agents as the cause of diseases classified elsewhere: Secondary | ICD-10-CM | POA: Insufficient documentation

## 2017-02-23 DIAGNOSIS — Z3169 Encounter for other general counseling and advice on procreation: Secondary | ICD-10-CM

## 2017-02-23 DIAGNOSIS — N898 Other specified noninflammatory disorders of vagina: Secondary | ICD-10-CM

## 2017-02-23 DIAGNOSIS — R300 Dysuria: Secondary | ICD-10-CM

## 2017-02-23 DIAGNOSIS — N760 Acute vaginitis: Secondary | ICD-10-CM | POA: Insufficient documentation

## 2017-02-23 DIAGNOSIS — N390 Urinary tract infection, site not specified: Secondary | ICD-10-CM

## 2017-02-23 LAB — POCT URINALYSIS DIP (DEVICE)
BILIRUBIN URINE: NEGATIVE
GLUCOSE, UA: NEGATIVE mg/dL
KETONES UR: NEGATIVE mg/dL
NITRITE: NEGATIVE
PH: 6 (ref 5.0–8.0)
PROTEIN: NEGATIVE mg/dL
Specific Gravity, Urine: 1.015 (ref 1.005–1.030)
Urobilinogen, UA: 0.2 mg/dL (ref 0.0–1.0)

## 2017-02-23 LAB — POCT URINE PREGNANCY: PREG TEST UR: NEGATIVE

## 2017-02-23 MED ORDER — PRENATAL 27-0.8 MG PO TABS: 1.0000 | ORAL_TABLET | Freq: Every day | ORAL | 11 refills | Status: DC

## 2017-02-23 MED ORDER — SULFAMETHOXAZOLE-TRIMETHOPRIM 800-160 MG PO TABS: 1.0000 | ORAL_TABLET | Freq: Two times a day (BID) | ORAL | 0 refills | Status: DC

## 2017-02-23 NOTE — Patient Instructions (Addendum)
January 20, 2017 was roughly the last day of previous menstrual cycle.  Prenatal Care WHAT IS PRENATAL CARE? Prenatal care is the process of caring for a pregnant woman before she gives birth. Prenatal care makes sure that she and her baby remain as healthy as possible throughout pregnancy. Prenatal care may be provided by a midwife, family practice health care provider, or a childbirth and pregnancy specialist (obstetrician). Prenatal care may include physical examinations, testing, treatments, and education on nutrition, lifestyle, and social support services. WHY IS PRENATAL CARE SO IMPORTANT? Early and consistent prenatal care increases the chance that you and your baby will remain healthy throughout your pregnancy. This type of care also decreases a baby's risk of being born too early (prematurely), or being born smaller than expected (small for gestational age). Any underlying medical conditions you may have that could pose a risk during your pregnancy are discussed during prenatal care visits. You will also be monitored regularly for any new conditions that may arise during your pregnancy so they can be treated quickly and effectively. WHAT HAPPENS DURING PRENATAL CARE VISITS? Prenatal care visits may include the following: Discussion Tell your health care provider about any new signs or symptoms you have experienced since your last visit. These might include:  Nausea or vomiting.  Increased or decreased level of energy.  Difficulty sleeping.  Back or leg pain.  Weight changes.  Frequent urination.  Shortness of breath with physical activity.  Changes in your skin, such as the development of a rash or itchiness.  Vaginal discharge or bleeding.  Feelings of excitement or nervousness.  Changes in your baby's movements.  You may want to write down any questions or topics you want to discuss with your health care provider and bring them with you to your  appointment. Examination During your first prenatal care visit, you will likely have a complete physical exam. Your health care provider will often examine your vagina, cervix, and the position of your uterus, as well as check your heart, lungs, and other body systems. As your pregnancy progresses, your health care provider will measure the size of your uterus and your baby's position inside your uterus. He or she may also examine you for early signs of labor. Your prenatal visits may also include checking your blood pressure and, after about 10-12 weeks of pregnancy, listening to your baby's heartbeat. Testing Regular testing often includes:  Urinalysis. This checks your urine for glucose, protein, or signs of infection.  Blood count. This checks the levels of white and red blood cells in your body.  Tests for sexually transmitted infections (STIs). Testing for STIs at the beginning of pregnancy is routinely done and is required in many states.  Antibody testing. You will be checked to see if you are immune to certain illnesses, such as rubella, that can affect a developing fetus.  Glucose screen. Around 24-28 weeks of pregnancy, your blood glucose level will be checked for signs of gestational diabetes. Follow-up tests may be recommended.  Group B strep. This is a bacteria that is commonly found inside a woman's vagina. This test will inform your health care provider if you need an antibiotic to reduce the amount of this bacteria in your body prior to labor and childbirth.  Ultrasound. Many pregnant women undergo an ultrasound screening around 18-20 weeks of pregnancy to evaluate the health of the fetus and check for any developmental abnormalities.  HIV (human immunodeficiency virus) testing. Early in your pregnancy, you will be screened  for HIV. If you are at high risk for HIV, this test may be repeated during your third trimester of pregnancy.  You may be offered other testing based on  your age, personal or family medical history, or other factors. HOW OFTEN SHOULD I PLAN TO SEE MY HEALTH CARE PROVIDER FOR PRENATAL CARE? Your prenatal care check-up schedule depends on any medical conditions you have before, or develop during, your pregnancy. If you do not have any underlying medical conditions, you will likely be seen for checkups:  Monthly, during the first 6 months of pregnancy.  Twice a month during months 7 and 8 of pregnancy.  Weekly starting in the 9th month of pregnancy and until delivery.  If you develop signs of early labor or other concerning signs or symptoms, you may need to see your health care provider more often. Ask your health care provider what prenatal care schedule is best for you. WHAT CAN I DO TO KEEP MYSELF AND MY BABY AS HEALTHY AS POSSIBLE DURING MY PREGNANCY?  Take a prenatal vitamin containing 400 micrograms (0.4 mg) of folic acid every day. Your health care provider may also ask you to take additional vitamins such as iodine, vitamin D, iron, copper, and zinc.  Take 1500-2000 mg of calcium daily starting at your 20th week of pregnancy until you deliver your baby.  Make sure you are up to date on your vaccinations. Unless directed otherwise by your health care provider: ? You should receive a tetanus, diphtheria, and pertussis (Tdap) vaccination between the 27th and 36th week of your pregnancy, regardless of when your last Tdap immunization occurred. This helps protect your baby from whooping cough (pertussis) after he or she is born. ? You should receive an annual inactivated influenza vaccine (IIV) to help protect you and your baby from influenza. This can be done at any point during your pregnancy.  Eat a well-rounded diet that includes: ? Fresh fruits and vegetables. ? Lean proteins. ? Calcium-rich foods such as milk, yogurt, hard cheeses, and dark, leafy greens. ? Whole grain breads.  Do noteat seafood high in mercury,  including: ? Swordfish. ? Tilefish. ? Shark. ? King mackerel. ? More than 6 oz tuna per week.  Do not eat: ? Raw or undercooked meats or eggs. ? Unpasteurized foods, such as soft cheeses (brie, blue, or feta), juices, and milks. ? Lunch meats. ? Hot dogs that have not been heated until they are steaming.  Drink enough water to keep your urine clear or pale yellow. For many women, this may be 10 or more 8 oz glasses of water each day. Keeping yourself hydrated helps deliver nutrients to your baby and may prevent the start of pre-term uterine contractions.  Do not use any tobacco products including cigarettes, chewing tobacco, or electronic cigarettes. If you need help quitting, ask your health care provider.  Do not drink beverages containing alcohol. No safe level of alcohol consumption during pregnancy has been determined.  Do not use any illegal drugs. These can harm your developing baby or cause a miscarriage.  Ask your health care provider or pharmacist before taking any prescription or over-the-counter medicines, herbs, or supplements.  Limit your caffeine intake to no more than 200 mg per day.  Exercise. Unless told otherwise by your health care provider, try to get 30 minutes of moderate exercise most days of the week. Do not  do high-impact activities, contact sports, or activities with a high risk of falling, such as horseback riding or  downhill skiing.  Get plenty of rest.  Avoid anything that raises your body temperature, such as hot tubs and saunas.  If you own a cat, do not empty its litter box. Bacteria contained in cat feces can cause an infection called toxoplasmosis. This can result in serious harm to the fetus.  Stay away from chemicals such as insecticides, lead, mercury, and cleaning or paint products that contain solvents.  Do not have any X-rays taken unless medically necessary.  Take a childbirth and breastfeeding preparation class. Ask your health care  provider if you need a referral or recommendation.  This information is not intended to replace advice given to you by your health care provider. Make sure you discuss any questions you have with your health care provider. Document Released: 07/23/2003 Document Revised: 12/23/2015 Document Reviewed: 10/04/2013 Elsevier Interactive Patient Education  2017 Elsevier Inc. Prenatal Vitamin and Mineral Combinations (oral solid dosage forms) What is this medicine? PRENATAL VITAMIN AND MINERAL combinations are used before, during, and after pregnancy to help provide provide good nutrition. This medicine may be used for other purposes; ask your health care provider or pharmacist if you have questions. COMMON BRAND NAME(S): Active OB, Advanced Care Plus, Advanced NatalCare, Advanced-RF NatalCare, Aminate Fe, Anemagen OB, B-Nexa, BP FoliNatal Plus B, BP MultiNatal Plus, BP MultiNatal Plus Chewable, BP Prenate, Brainstrong, Bright Beginnings Prenatal, Cal-Nate, Calcium PNV, CareNatal DHA, CareNate 600, Cavan One Omega, Cavan Prenatal with EC Calcium, Cavan-Alpha, Cavan-EC SOD DHA, Cavan-Heme OB, Cavan-Heme Omega, Cenogen Ultra, Administrator, sportsCentrum Specialist Prenatal, CertaVite with Antioxidants, Choice-OB + DHA, Citracal Prenatal, Citracal Prenatal + DHA, CitraNatal 90 DHA, CitraNatal Assure, CitraNatal B-Calm, CitraNatal DHA, CitraNatal Harmony, CitraNatal Rx, Classic Prenatal, ComBi Rx, Complete Natal DHA, Complete-RF, CompleteNate, Concept DHA, Concept OB, CoreNate-DHA, Corvite FE with Quatrefolic, CRNatal DHA, Daily Vitamin, Docosavit, Duet, Duet Chewable, Duet DHA, Duet DHA 400, Duet DHA 430ec, Duet DHA Balanced, Duet DHA Complete, Duet DHA Complete Gluten Free, Duet DHA EC, Duet DHA Ferrazone, EC Omega-3, DuoVit DHA, Edge OB, Elite OB, Elite OB with DHA, Elite-OB 400, Extra-Virt Plus, Femecal OB, Femecal OB Plus DHA, Ferrocite Plus, Focalgin 90 DHA, Focalgin CA, GoldendaleFolbecal, [place]Folcal DHA, [address] One, [address] Omega 3,  DiabloFolet One with CarterQuatrefolic, RiftonFolivane OB, Folivane-EC Calcium DHA NF, Foltabs 90 Plus DHA, Foltabs Prenatal, Foltabs Prenatal Plus DHA, Gesticare, Gesticare DHA, Gesticare DHA Delayed-Release, HemeNatal OB, HemeNatal OB + DHA, Hemocyte Plus, HIP Prenatal, ICAR Prenatal Rx, iNatal Advance, iNatal GT, iNatal Ultra, Infanate Balance, Infanate DHA, Infanate Plus, Kolnatal, Lactocal-F, Levomefolate PNV, MACNATAL CN DHA, Marnatal-F, Marnatal-F Plus, Materna, Maxinate, Mission Prenatal, Mission Prenatal F.A., Mission Prenatal H.P., Mom's Choice Rx, Multi-Nate 30, Multi-Nate 30 DHA, Multi-Nate DHA Extra, Multifol Plus, Multivitamin With Minerals, MyNatal OB Prenatal, NataCaps, NataChew, NataFolic-OB, Natafort, Natal-V RX, NatalCare CFe 60, NatalCare GlossTabs, NatalCare PIC, C.H. Robinson WorldwideatalCare PIC Forte, United ParcelatalCare Plus, Schering-PloughatalCare Rx, Air Products and ChemicalsatalCare Three, NATALVIRT 90 DHA, NATALVIRT CA, NataTab CFe, NataTab FA, Natatab Rx, Natelle, Natelle C, Natelle One, Hess Corporationatelle Plus with DHA, Natelle Prefer, Natelle-ez, Navatab + DHA, Neevo, Neevo DHA, Nestabs, Nestabs ABC, Nestabs CBF, Nestabs DHA, Nestabs FA, Nestabs Rx, New Advanced Formula Prenatal Z, Nexa Plus, Nexa Select, Niferex-PN, Niferex-PN Forte, Niva-Plus, NovaNatal, NovaStart, Nu-Natal, NutraCare, Nutri-Tab OB, Nutri-Tab OB + DHA, Nutrinate, NutriSpire, O-Cal F.A., O-Cal Prenatal, OB Choice, OB Complete, OB Complete 400, OB Complete One, OB Complete Petite, OB Complete Premier, OB Complete with DHA, OB-Natal One, Obstetrix EC Prenatal, Obstetrix-100, Obtrex, Obtrex DHA, One-A-Day Women's, One-A-Day Women's Prenatal, OptiNate, Paire OB Tablet Plus DHA, PNV OB +  DHA, PNV Prenatal, PNV Prenatal Plus Multivitamin, PNV Tabs 29-1, PNV-DHA, PNV-DHA + Docusate, PNV-DHA Plus, PNV-First, PNV-Iron, PNV-OB with DHA, PNV-Omega, PNV-Select, PNV-Total with DHA, PNV-VP-U, PR [place] 400, PR [address] 400ec, PR [address] 430, PR [address] 430ec, PR [place] 440ec, PreCare, PreferaOB, PreferaOB + DHA, PreferaOB One,  Premesis Rx, [address], [address] Plus, [address], [address], [place], Maud, Prenaissance 90 DHA, Prenaissance Balance, Prenaissance DHA, Prenaissance Harmony DHA, Prenaissance Next, Prenaissance Plus, Prenaissance Promise, PrenaPlus, Prenat with Quatrefolic, PreNata Multivitamin with Iron, Prenatabs CBF, Prenatabs FA, Prenatabs OBN, Prenatabs RX, Prenatal, Prenatal 1 Plus 1, Prenatal 19, Prenatal AD, Prenatal Formula 3, Prenatal H, Prenatal Low Iron, Prenatal MR 36 Fe, Prenatal MTR with Selenium, Prenatal Multivitamin + DHA 2, Prenatal Optima Advance, Prenatal Plus, Prenatal Plus Iron, Prenatal Plus Low Iron, Prenatal Rx with Beta Carotene, Prenatal S, Prenatal U, Prenatal Vitamin, PreNatal Vitamins Plus, Prenate Advance, Prenate AM with Quatrefolic, Prenate DHA, Prenate Elite, Prenate Enhance with [employer] Life, Prenate Essential, Prenate GT, Prenate Mini, PreNate Plus, Prenate Restore with Quatrefolic, [address] Prenate Ultra, Prenavite, Prenavite Protein, PreNexa, Yahoo! Inc, PrePLUS, PreQue, PreTAB, Previte Rx, PrimaCare, Rockwell [employer]ation, PrimaCare ONE, Provida OB, PruEt DHA, PruEt DHAec, PureFe OB Plus, PureFe Plus, PureVit DualFe Plus, RE DualVit OB, RE DualVit Plus, RE OB + DHA, RE OB 90 + DHA, RE Prenatal, RE PreVit + DHA, RE-Nata 29, RE-Nata 29 OB, Reaphrim, Renate, Renate DHA, Renate DHA Extra, REocyte Plus, Right Step, Rovin-Nv, Rovin-Nv DHA, Rulavite, Se-Care, Se-Care Conceive, Se-Care Gesture, Se-Natal 19, Se-Natal 19 Chewable, Se-Natal 90, Se-Natal ONE, Se-Plete DHA, Se-Tan DHA, Se-Tan Plus, Select-OB, Select-OB + DHA, SetonET, SetonET-EC DHA, StrongStart, StrongStart Chewable Tablet, Stuart One, Stuart Prenatal + DHA, Stuartnatal Plus 3, Tandem DHA, Tandem OB, Tandem Plus, Taron A Prenatal Pack with DHA, Taron EC Calcium DHA Pack, Taron Prenatal with DHA, Taron-C DHA, Taron-EC Cal, Taron-Prex Prenatal with DHA, Thera [place] Complete, Thera [place] Core Nutrition, Thera [place]  Lactation Support, Thera [address] OvaVite, Thera NIKE, [place], Thrivite, TL-Care DHA, TL-Select, TL-Select DHA, Tri Rx, TriAdvance, Kohl's, TriCare Prenatal DHA ONE, Trifera OB, Trimesis Rx, Trinatal GT, Folsom Rx 1, Trinate, TriStart DHA, Triveen-Duo DHA, Triveen-PRx RNF, Triveen-Ten, Trust Harley-Davidson, UltimateCare Advantage, Economist Combo, Economist ONE, UltimateCare ONE NF, Coca Cola, VemaVite-PRx 2, Vena-Bal DHA, Venatal-FA, Verotin-BY, Verotin-GR, Vinacal, Vinacal B, Vinatal Forte, Vinate 90, Vinate AZ, Vinate AZ Extra, Vinate C, Vinate Calcium, Vinate Care, Vinate DHA, Vinate GT, Vinate IC, Vinate II, Vinate III, Vinate M Low Iron, Vinate One, Vinate PN, Vinate Ultra, Virt-Advance, Virt-C DHA, Virt-Care One, Virt-Nate, Virt-PN, Virt-PN DHA, Virt-PN Plus, Virt-Select, Virt-Vite GT, VirtPrex, Vitafol PN, Vitafol Ultra, Vitafol-Nano Prenatal, Vitafol-OB, Vitafol-OB + DHA, Vitafol-OB and DHA, Vitafol-One, VitaMed MD Plus Rx, VitaNatal OB Plus DHA, vitaPearl Prenatal, VitaPhil, VitaPhil + DHA, VitaPhil + DHA 90, VitaPhil AiDE, VitaSpire, Viva CT, VIVA DHA, Vol-Tab Rx, VP CH Ultra, VP-CH-PNV, VP-GGR-B6 Prenatal, VP-HEME One, VP-PNV-DHA, Zatean-CH, Zatean-Pn, Zatean-Pn DHA, Zatean-Pn Plus, Zingiber What should I tell my health care provider before I take this medicine? They need to know if you have any of these conditions: -bleeding or clotting disorder -history of anemia of any type -other chronic health condition -an unusual or allergic reaction to vitamins, minerals, other medicines, foods, dyes, or preservatives How should I use this medicine? Take this medicine by mouth with a glass of water. You can take it with or without food. If it upsets your stomach, take it with food. Chewable prenatal vitamin tablets may be chewed completely before swallowing. Follow the directions on the  prescription label. The usual dose is taken once a day. Do not take your medicine more often than  directed. Contact your pediatrician regarding the use of this medicine in children. Special care may be needed. This medicine is intended for females who are pregnant, breast-feeding, or may become pregnant. Overdosage: If you think you have taken too much of this medicine contact a poison control center or emergency room at once. NOTE: This medicine is only for you. Do not share this medicine with others. What if I miss a dose? If you miss a dose, take it as soon as you can. If it is almost time for your next dose, take only that dose. Do not take double or extra doses. What may interact with this medicine? -alendronate -antacids -cefdinir -cefditoren -etidronate -fluoroquinolone antibiotics (examples: ciprofloxacin, gatifloxacin, levofloxacin) -ibandronate -levodopa -risedronate -tetracycline antibiotics (examples: doxycycline, minocycline, tetracycline) -thyroid hormones -warfarin This list may not describe all possible interactions. Give your health care provider a list of all the medicines, herbs, non-prescription drugs, or dietary supplements you use. Also tell them if you smoke, drink alcohol, or use illegal drugs. Some items may interact with your medicine. What should I watch for while using this medicine? See your health care professional for regular checks on your progress. Remember that vitamin and mineral supplements do not replace the need for good nutrition from a balanced diet. Stools commonly change color when vitamins and minerals are taken. Notify your health care professional if this change is alarming or accompanied by other symptoms, like abdominal pain. What side effects may I notice from receiving this medicine? Side effects that you should report to your doctor or health care professional as soon as possible: -allergic reaction such as skin rash or difficulty breathing -vomiting Side effects that usually do not require medical attention (report to your doctor or  health care professional if they continue or are bothersome): -nausea -stomach upset This list may not describe all possible side effects. Call your doctor for medical advice about side effects. You may report side effects to FDA at 1-800-FDA-1088. Where should I keep my medicine? Keep out of the reach of children. Most vitamins and minerals should be stored at controlled room temperature. Check your specific product directions. Protect from heat and moisture. Throw away any unused medicine after the expiration date. NOTE: This sheet is a summary. It may not cover all possible information. If you have questions about this medicine, talk to your doctor, pharmacist, or health care provider.  2018 Elsevier/Gold Standard (2015-02-14 09:02:38)

## 2017-02-23 NOTE — Progress Notes (Signed)
Ms. Deniqua Perry, a 21 year old female presents for preconception counseling. She says that she has been actively trying to conceive over the past several months. She reports a normal menstrual cycle. Previous menstrual cycle was on 02/19/2017.    Dysuria   This is a new problem. The current episode started in the past 7 days. The pain is at a severity of 3/10. The pain is mild. There has been no fever. She is sexually active. There is no history of pyelonephritis. Associated symptoms include a discharge. Pertinent negatives include no flank pain, frequency, hematuria, hesitancy, nausea, possible pregnancy, sweats, urgency or vomiting. She has tried nothing for the symptoms.   Past Medical History:  Diagnosis Date  . Allergic rhinitis 10/23/2013  . Allergy    Social History   Social History  . Marital status: Single    Spouse name: N/A  . Number of children: N/A  . Years of education: N/A   Occupational History  . Not on file.   Social History Main Topics  . Smoking status: Never Smoker  . Smokeless tobacco: Never Used  . Alcohol use No  . Drug use: No  . Sexual activity: Yes    Birth control/ protection: None   Other Topics Concern  . Not on file   Social History Narrative  . No narrative on file   Immunization History  Administered Date(s) Administered  . DTaP 12/28/1995, 04/19/1996, 06/14/1996, 01/30/1997, 04/10/1999  . Hepatitis A, Ped/Adol-2 Dose 12/05/2013  . Hepatitis B [birth year], 12/28/1995, 04/19/1996  . HiB (PRP-OMP) 04/19/1996  . IPV 12/28/1995, 04/19/1996, 06/14/1996, 04/10/1999  . Influenza,inj,Quad PF,36+ Mos 10/04/2015  . MMR 12/28/1995, 04/10/1999  . Meningococcal Conjugate 12/05/2013  . Tdap 03/26/2007  . Varicella 12/05/2013  Review of Systems  HENT: Negative.   Respiratory: Negative.   Cardiovascular: Negative.   Gastrointestinal: Negative.  Negative for nausea and vomiting.  Genitourinary: Positive for dysuria. Negative for flank pain,  frequency, hematuria, hesitancy and urgency.  Musculoskeletal: Negative.   Skin: Negative.   Neurological: Negative.   Endo/Heme/Allergies: Negative.   Psychiatric/Behavioral: Negative.    Physical Exam  HENT:  Head: Normocephalic.  Eyes: Pupils are equal, round, and reactive to light.  Neck: Normal range of motion.  Cardiovascular: Normal rate, regular rhythm, normal heart sounds and intact distal pulses.   Pulmonary/Chest: Effort normal and breath sounds normal.  Abdominal: Soft. Bowel sounds are normal.  Musculoskeletal: Normal range of motion.  Neurological: She is alert.  Skin: Skin is warm.  Psychiatric: She has a normal mood and affect. Her behavior is normal.   Plan   1. Urinary tract infection without hematuria, site unspecified Ms. Hemmelgarn is c/o dysuria and has large leukocytes on urinalysis. Will treat empirically with antibiotic for 7 days.  Increase water intake to 6-8 glasses Wipe from front to back  - sulfamethoxazole-trimethoprim (BACTRIM DS,SEPTRA DS) 800-160 MG tablet; Take 1 tablet by mouth 2 (two) times daily.  Dispense: 14 tablet; Refill: 0 - Urine Culture  2. Dysuria - POCT urine pregnancy - Urine Culture  3. Pre-conception counseling Prenatal vitamin  - Prenatal-DSS-FeCb-FeGl-FA (CITRANATAL BLOOM) 90-1 MG TABS; Take 1 tablet by mouth daily.  Dispense: 30 tablet; Refill: 11  4. Vaginal discharge - Cervicovaginal ancillary only    RTC: Follow up as needed  Donia Pounds  MSN, FNP-C [facility] [address] [place], [place] [address] [phone]

## 2017-02-24 ENCOUNTER — Other Ambulatory Visit: Payer: Self-pay | Admitting: Family Medicine

## 2017-02-24 ENCOUNTER — Telehealth: Payer: Self-pay

## 2017-02-24 DIAGNOSIS — N760 Acute vaginitis: Principal | ICD-10-CM

## 2017-02-24 DIAGNOSIS — B9689 Other specified bacterial agents as the cause of diseases classified elsewhere: Secondary | ICD-10-CM

## 2017-02-24 LAB — CERVICOVAGINAL ANCILLARY ONLY
BACTERIAL VAGINITIS: POSITIVE — AB
CANDIDA VAGINITIS: NEGATIVE
Trichomonas: NEGATIVE

## 2017-02-24 MED ORDER — METRONIDAZOLE 500 MG PO TABS: 500.0000 mg | ORAL_TABLET | Freq: Two times a day (BID) | ORAL | 0 refills | Status: DC

## 2017-02-24 MED ORDER — CITRANATAL BLOOM 90-1 MG PO TABS: 1.0000 | ORAL_TABLET | Freq: Every day | ORAL | 11 refills | Status: DC

## 2017-02-24 NOTE — Progress Notes (Signed)
Meds ordered this encounter  Medications  . metroNIDAZOLE (FLAGYL) 500 MG tablet    Sig: Take 1 tablet (500 mg total) by mouth 2 (two) times daily.    Dispense:  14 tablet    Refill:  0    Demari Gales Moore Meli Faley  MSN, FNP-C [facility] [address]  , [place] [address] [phone]  

## 2017-02-24 NOTE — Telephone Encounter (Signed)
-----   Message from Massie MaroonLachina M Hollis, [place] sent at 02/24/2017  4:15 PM EDT ----- Please inform Ms. Varin that vaginal swab was positive for bacterial vaginitis.  Will start Metronidazole 500 mg BID for 7 days. Refrain from alcohol while taking metronidazole.    Thanks

## 2017-02-24 NOTE — Telephone Encounter (Signed)
Called and spoke with patient, advised of sample positive for bacteria vaginitis and to start metronidazole 500mg  twice daily for 7 days. Advised to not drink alcohol while on medication and that it was sent into [facility]. Patient verbalized understanding. Thanks!

## 2017-02-25 ENCOUNTER — Other Ambulatory Visit: Payer: Self-pay | Admitting: Family Medicine

## 2017-02-25 DIAGNOSIS — N760 Acute vaginitis: Principal | ICD-10-CM

## 2017-02-25 DIAGNOSIS — B9689 Other specified bacterial agents as the cause of diseases classified elsewhere: Secondary | ICD-10-CM

## 2017-02-25 MED ORDER — METRONIDAZOLE 0.75 % VA GEL: 1.0000 | Freq: Every day | VAGINAL | 0 refills | Status: AC

## 2017-02-26 LAB — URINE CULTURE

## 2017-07-20 ENCOUNTER — Ambulatory Visit: Payer: Self-pay | Admitting: Family Medicine

## 2017-07-20 ENCOUNTER — Encounter: Payer: Self-pay | Admitting: Family Medicine

## 2017-07-20 ENCOUNTER — Ambulatory Visit (INDEPENDENT_AMBULATORY_CARE_PROVIDER_SITE_OTHER): Payer: Self-pay | Admitting: Family Medicine

## 2017-07-20 VITALS — BP 120/60 | HR 92 | Temp 98.5°F | Resp 16 | Ht 59.0 in | Wt 170.2 lb

## 2017-07-20 DIAGNOSIS — E669 Obesity, unspecified: Secondary | ICD-10-CM

## 2017-07-20 DIAGNOSIS — Z3009 Encounter for other general counseling and advice on contraception: Secondary | ICD-10-CM

## 2017-07-20 DIAGNOSIS — Z131 Encounter for screening for diabetes mellitus: Secondary | ICD-10-CM

## 2017-07-20 DIAGNOSIS — Z3200 Encounter for pregnancy test, result unknown: Secondary | ICD-10-CM

## 2017-07-20 DIAGNOSIS — Z23 Encounter for immunization: Secondary | ICD-10-CM

## 2017-07-20 LAB — POCT URINALYSIS DIP (DEVICE)
Bilirubin Urine: NEGATIVE
Glucose, UA: NEGATIVE mg/dL
KETONES UR: NEGATIVE mg/dL
Nitrite: NEGATIVE
PH: 7 (ref 5.0–8.0)
PROTEIN: NEGATIVE mg/dL
SPECIFIC GRAVITY, URINE: 1.025 (ref 1.005–1.030)
Urobilinogen, UA: 0.2 mg/dL (ref 0.0–1.0)

## 2017-07-20 LAB — POCT GLYCOSYLATED HEMOGLOBIN (HGB A1C): Hemoglobin A1C: 5.2

## 2017-07-20 LAB — POCT URINE PREGNANCY: Preg Test, Ur: NEGATIVE

## 2017-07-20 MED ORDER — FOLIC ACID 800 MCG PO TABS: 800.0000 ug | ORAL_TABLET | Freq: Every day | ORAL | 1 refills | Status: DC

## 2017-07-20 NOTE — Patient Instructions (Addendum)
I encourage engagement in physical activity. Aim for 15 minutes of vigorous physical activity at minimal 3-5 days per week and aim to increase to a total.    I recommend taking folic acid as you are intending to become pregnant.    Prediabetes Eating Plan Prediabetes-also called impaired glucose tolerance or impaired fasting glucose-is a condition that causes blood sugar (blood glucose) levels to be higher than normal. Following a healthy diet can help to keep prediabetes under control. It can also help to lower the risk of type 2 diabetes and heart disease, which are increased in people who have prediabetes. Along with regular exercise, a healthy diet:  Promotes weight loss.  Helps to control blood sugar levels.  Helps to improve the way that the body uses insulin.  What do I need to know about this eating plan?  Use the glycemic index (GI) to plan your meals. The index tells you how quickly a food will raise your blood sugar. Choose low-GI foods. These foods take a longer time to raise blood sugar.  Pay close attention to the amount of carbohydrates in the food that you eat. Carbohydrates increase blood sugar levels.  Keep track of how many calories you take in. Eating the right amount of calories will help you to achieve a healthy weight. Losing about 7 percent of your starting weight can help to prevent type 2 diabetes.  You may want to follow a Mediterranean diet. This diet includes a lot of vegetables, lean meats or fish, whole grains, fruits, and healthy oils and fats. What foods can I eat? Grains Whole grains, such as whole-wheat or whole-grain breads, crackers, cereals, and pasta. Unsweetened oatmeal. Bulgur. Barley. Quinoa. Brown rice. Corn or whole-wheat flour tortillas or taco shells. Vegetables Lettuce. Spinach. Peas. Beets. Cauliflower. Cabbage. Broccoli. Carrots. Tomatoes. Squash. Eggplant. Herbs. Peppers. Onions. Cucumbers. Brussels sprouts. Fruits Berries. Bananas.  Apples. Oranges. Grapes. Papaya. Mango. Pomegranate. Kiwi. Grapefruit. Cherries. Meats and Other Protein Sources Seafood. Lean meats, such as chicken and [place] or lean cuts of pork and beef. Tofu. Eggs. Nuts. Beans. Dairy Low-fat or fat-free dairy products, such as yogurt, cottage cheese, and cheese. Beverages Water. Tea. Coffee. Sugar-free or diet soda. Seltzer water. Milk. Milk alternatives, such as soy or almond milk. Condiments Mustard. Relish. Low-fat, low-sugar ketchup. Low-fat, low-sugar barbecue sauce. Low-fat or fat-free mayonnaise. Sweets and Desserts Sugar-free or low-fat pudding. Sugar-free or low-fat ice cream and other frozen treats. Fats and Oils Avocado. Walnuts. Olive oil. The items listed above may not be a complete list of recommended foods or beverages. Contact your dietitian for more options. What foods are not recommended? Grains Refined white flour and flour products, such as bread, pasta, snack foods, and cereals. Beverages Sweetened drinks, such as sweet iced tea and soda. Sweets and Desserts Baked goods, such as cake, cupcakes, pastries, cookies, and cheesecake. The items listed above may not be a complete list of foods and beverages to avoid. Contact your dietitian for more information. This information is not intended to replace advice given to you by your health care provider. Make sure you discuss any questions you have with your health care provider. Document Released: 12/04/2014 Document Revised: 12/26/2015 Document Reviewed: 08/15/2014 Elsevier Interactive Patient Education  2017 [employer]

## 2017-07-20 NOTE — Progress Notes (Signed)
Jillian Richardson, is a 21 y.o. female  [id number]  [id number]  DOB - [birth year]  CC:  Chief Complaint  Patient presents with  . Follow-up    Wellness visit       HPI: Jillian Richardson is a 21 y.o. female is here today for routine wellness follow-up. She has no significant medical history.  During her last office visit 02/23/2017 she verbalized an interest in becoming pregnant and was prescribed prenatal vitamins and counseled on family-planning.  Today she reports a continued interest in becoming pregnant, she has not started prenatal vitamins. Patient's last menstrual period was 06/29/2017.  She continues to have routine monthly.'s.  She is not using any forms of contraception.  She denies any new sexual partners.  She is concerned about diabetes as both of her parents have diabetes.  She is obese current Body mass index is 34.38 kg/m.  She engages in no routine physical activities and endorses a diet rich sugar and starches.  She denies headache, chest pain, abdominal pain, nausea, weakness tingling or numbness, cough, or SOB. No Known Allergies Past Medical History:  Diagnosis Date  . Allergic rhinitis 10/23/2013  . Allergy    No current outpatient medications on file prior to visit.   No current facility-administered medications on file prior to visit.    Family History  Problem Relation Age of Onset  . Diabetes Mother   . Diabetes Father    Social History   Socioeconomic History  . Marital status: Single    Spouse name: Not on file  . Number of children: Not on file  . Years of education: Not on file  . Highest education level: Not on file  Social Needs  . Financial resource strain: Not on file  . Food insecurity - worry: Not on file  . Food insecurity - inability: Not on file  . Transportation needs - medical: Not on file  . Transportation needs - non-medical: Not on file  Occupational History  . Not on file  Tobacco Use  . Smoking status: Never Smoker  .  Smokeless tobacco: Never Used  Substance and Sexual Activity  . Alcohol use: No  . Drug use: No  . Sexual activity: Yes    Birth control/protection: None  Other Topics Concern  . Not on file  Social History Narrative  . Not on file    Review of Systems: Constitutional: Negative for fever, chills, diaphoresis, activity change, appetite change and fatigue. HENT: Negative for ear pain, nosebleeds, congestion, facial swelling, rhinorrhea, neck pain, neck stiffness and ear discharge.  Eyes: Negative for pain, discharge, redness, itching and visual disturbance. Respiratory: Negative for cough, choking, chest tightness, shortness of breath, wheezing and stridor.  Cardiovascular: Negative for chest pain, palpitations and leg swelling. Gastrointestinal: Negative for abdominal distention. Genitourinary: Negative for dysuria, urgency, frequency, hematuria, flank pain, decreased urine volume, difficulty urinating and dyspareunia.  Musculoskeletal: Negative for back pain, joint swelling, arthralgia and gait problem. Neurological: Negative for dizziness, tremors, seizures, syncope, facial asymmetry, speech difficulty, weakness, light-headedness, numbness and headaches.  Hematological: Negative for adenopathy. Does not bruise/bleed easily. Psychiatric/Behavioral: Negative for hallucinations, behavioral problems, confusion, dysphoric mood, decreased concentration and agitation.    Objective:   Vitals:   07/20/17 1418  BP: 120/60  Pulse: 92  Resp: 16  Temp: 98.5 F (36.9 C)  SpO2: 99%    Physical Exam: Constitutional: Patient appears well-developed and well-nourished. No distress. HENT: Normocephalic, atraumatic, External right and left ear normal. Oropharynx is clear and moist.  Eyes: Conjunctivae and EOM are normal. PERRLA, no scleral icterus. Neck: Normal ROM. Neck supple. No JVD. No tracheal deviation. No thyromegaly. CVS: RRR, S1/S2 +, no murmurs, no gallops, no carotid bruit.   Pulmonary: Effort and breath sounds normal, no stridor, rhonchi, wheezes, rales.  Abdominal: Soft. BS +, no distension, tenderness, rebound or guarding.  Musculoskeletal: Normal range of motion. No edema and no tenderness.  Lymphadenopathy: No lymphadenopathy noted, cervical, inguinal or axillary Neuro: Alert. Normal reflexes, muscle tone coordination. No cranial nerve deficit. Skin: Skin is warm and dry. No rash noted. Not diaphoretic. No erythema. No pallor. Psychiatric: Normal mood and affect. Behavior, judgment, thought content normal.  Lab Results  Component Value Date   WBC 13.0 (H) 06/29/2016   HGB 12.9 06/29/2016   HCT 38.3 06/29/2016   MCV 94.3 06/29/2016   PLT 229 06/29/2016   Lab Results  Component Value Date   CREATININE 0.62 01/18/2017   BUN 11 01/18/2017   NA 137 01/18/2017   K 4.0 01/18/2017   CL 101 01/18/2017   CO2 25 01/18/2017    Lab Results  Component Value Date   HGBA1C 4.8 01/18/2017   Lipid Panel     Component Value Date/Time   CHOL 191 (H) 10/11/2015 0955   TRIG 141 10/11/2015 0955   HDL 70 10/11/2015 0955   CHOLHDL 2.7 10/11/2015 0955   VLDL 28 10/11/2015 0955   LDLCALC 93 10/11/2015 0955        Assessment and plan:  1. Obesity (BMI 30-39.9), patient counseled on the effects of persistent obesity on overall cardiovascular health and increased risk of diabetes.  She is encouraged to exercise a total of 150 minutes/week preferably vigorous cardiovascular focused activities.  Sample meal plan provided to patient indicating modified carbohydrates.  2. Screening for diabetes mellitus- POCT glycosylated hemoglobin (Hb A1C) 5.2, normal. Will recheck in 12 months.  3. Encounter for pregnancy test, result unknown, negative  4. Encounter for female family planning counseling, counseled on the benefit of taking folic acid and its effects in reducing the risk of neuro-tubal defects in unborn fetus. Encouraged prenatal vitamins. Recommended weight  loss-minimal 10-15 lbs prior to becoming pregnant to reduce risk of pregnancy-induced hypertension and diabetes.   5. Need for immunization against influenza- Flu Vaccine QUAD 36+ mos IM  Return in about 12 months (around 07/20/2018) for Complete Physical Exam.  The patient was given clear instructions to go to ER or return to medical center if symptoms don't improve, worsen or new problems develop. The patient verbalized understanding. The patient was told to call to get lab results if they haven't heard anything in the next week.    Godfrey PickKimberly S. Tiburcio PeaHarris, MSN, FNP-C [facility]  [address] [place], [place] [address] [phone]    This note has been created with Dragon speech recognition software and smart phrase technology. Any transcriptional errors are unintentional.

## 2017-07-27 ENCOUNTER — Encounter: Payer: Self-pay | Admitting: Family Medicine

## 2017-08-19 ENCOUNTER — Ambulatory Visit: Payer: Medicaid Other | Admitting: Family Medicine

## 2017-08-20 ENCOUNTER — Ambulatory Visit (INDEPENDENT_AMBULATORY_CARE_PROVIDER_SITE_OTHER): Payer: Medicaid Other | Admitting: Family Medicine

## 2017-08-20 ENCOUNTER — Encounter: Payer: Self-pay | Admitting: Family Medicine

## 2017-08-20 VITALS — BP 118/60 | HR 82 | Temp 98.2°F | Resp 14 | Ht 59.0 in | Wt 169.0 lb

## 2017-08-20 DIAGNOSIS — N390 Urinary tract infection, site not specified: Secondary | ICD-10-CM

## 2017-08-20 DIAGNOSIS — M545 Low back pain, unspecified: Secondary | ICD-10-CM

## 2017-08-20 LAB — POCT URINALYSIS DIP (DEVICE)
Bilirubin Urine: NEGATIVE
Glucose, UA: NEGATIVE mg/dL
Ketones, ur: NEGATIVE mg/dL
NITRITE: NEGATIVE
PH: 6 (ref 5.0–8.0)
PROTEIN: NEGATIVE mg/dL
Specific Gravity, Urine: 1.015 (ref 1.005–1.030)
UROBILINOGEN UA: 0.2 mg/dL (ref 0.0–1.0)

## 2017-08-20 LAB — POCT URINE PREGNANCY: PREG TEST UR: NEGATIVE

## 2017-08-20 MED ORDER — CYCLOBENZAPRINE HCL 5 MG PO TABS: 5.0000 mg | ORAL_TABLET | Freq: Every day | ORAL | 0 refills | Status: DC

## 2017-08-20 MED ORDER — IBUPROFEN 600 MG PO TABS: 600.0000 mg | ORAL_TABLET | Freq: Three times a day (TID) | ORAL | 0 refills | Status: DC | PRN

## 2017-08-20 MED ORDER — CIPROFLOXACIN HCL 250 MG PO TABS: 250.0000 mg | ORAL_TABLET | Freq: Two times a day (BID) | ORAL | 0 refills | Status: AC

## 2017-08-20 MED ORDER — KETOROLAC TROMETHAMINE 30 MG/ML IJ SOLN
30.0000 mg | Freq: Once | INTRAMUSCULAR | Status: AC
  Administered 2017-08-20: 30 mg via INTRAMUSCULAR

## 2017-08-20 NOTE — Patient Instructions (Signed)
You have an abnormal urinalysis, will treat empirically with ciprofloxacin 250 mg twice daily for 3 days.  Increase water intake to 6-8 glasses/day.low back pain and spasms   For low back pain and spasms will start a trial of Cyclobenzaprine 5 mg at bedtime. Will also continue Ibuprofen 600 mg every 6 hours per day with food.   Urinary Tract Infection, Adult A urinary tract infection (UTI) is an infection of any part of the urinary tract. The urinary tract includes the:  Kidneys.  Ureters.  Bladder.  Urethra.  These organs make, store, and get rid of pee (urine) in the body. Follow these instructions at home:  Take over-the-counter and prescription medicines only as told by your doctor.  If you were prescribed an antibiotic medicine, take it as told by your doctor. Do not stop taking the antibiotic even if you start to feel better.  Avoid the following drinks: ? Alcohol. ? Caffeine. ? Tea. ? Carbonated drinks.  Drink enough fluid to keep your pee clear or pale yellow.  Keep all follow-up visits as told by your doctor. This is important.  Make sure to: ? Empty your bladder often and completely. Do not to hold pee for long periods of time. ? Empty your bladder before and after sex. ? Wipe from front to back after a bowel movement if you are female. Use each tissue one time when you wipe. Contact a doctor if:  You have back pain.  You have a fever.  You feel sick to your stomach (nauseous).  You throw up (vomit).  Your symptoms do not get better after 3 days.  Your symptoms go away and then come back. Get help right away if:  You have very bad back pain.  You have very bad lower belly (abdominal) pain.  You are throwing up and cannot keep down any medicines or water. This information is not intended to replace advice given to you by your health care provider. Make sure you discuss any questions you have with your health care provider. Document Released:  01/06/2008 Document Revised: 12/26/2015 Document Reviewed: 06/10/2015 Elsevier Interactive Patient Education  [employer]2018 Elsevier Inc.

## 2017-08-20 NOTE — Progress Notes (Signed)
urina

## 2017-08-22 LAB — URINE CULTURE

## 2017-08-24 NOTE — Progress Notes (Signed)
Chief Complaint  Patient presents with  . Back Pain    Subjective:    Patient ID: Jillian Richardson, female    DOB: 08/27/1995, 22 y.o.   MRN: [id number]  HPI Patient presents accompanied by fiance complaining of low back pain over the past 7 days. She states that she did not sustain a back injury and has not idenitified palliative or provocative factors related to current back pain. She characterizes pain as intermittent and aching. She has attempted OTC Ibuprofen with unsatisfactory relief. She denies headache, fatigue, nausea, vomiting, or diarrhea. She endorses periodic dysuria, but not at present. Pain intensity is 3-4/10.    Review of Systems  Constitutional: Negative.   HENT: Negative.   Respiratory: Negative.   Cardiovascular: Negative.   Endocrine: Negative.   Genitourinary: Positive for dysuria. Negative for difficulty urinating, flank pain, menstrual problem, pelvic pain and urgency.  Musculoskeletal: Positive for back pain.  Allergic/Immunologic: Negative.   Neurological: Negative.   Hematological: Negative.        Objective:   Physical Exam  Cardiovascular: Normal rate, regular rhythm, normal heart sounds and intact distal pulses.  Pulmonary/Chest: Effort normal and breath sounds normal.  Abdominal: Soft. Bowel sounds are normal. There is CVA tenderness.  Musculoskeletal:       Lumbar back: She exhibits decreased range of motion, pain and spasm.         BP 118/60 (BP Location: Left Arm, Patient Position: Sitting, Cuff Size: Normal)   Pulse 82   Temp 98.2 F (36.8 C) (Oral)   Resp 14   Ht 4\' 11"  (1.499 m)   Wt 169 lb (76.7 kg)   LMP 08/03/2017   SpO2 100%   BMI 34.13 kg/m  Assessment & Plan:   Bilateral low back pain without sciatica, unspecified chronicity - POCT urine pregnancy - ketorolac (TORADOL) 30 MG/ML injection 30 mg - cyclobenzaprine (FLEXERIL) 5 MG tablet; Take 1 tablet (5 mg total) by mouth at bedtime.  Dispense: 15 tablet; Refill: 0 - POCT  urinalysis dip (device) - ibuprofen (ADVIL,MOTRIN) 600 MG tablet; Take 1 tablet (600 mg total) by mouth every 8 (eight) hours as needed.  Dispense: 30 tablet; Refill: 0 - Urine Culture  Urinary tract infection without hematuria, site unspecified CVA tenderness and periodic dysuria. Will treat empirically for a UTI with Ciprofloxacin 250 mg BID for 3 days. Will follow urine culture.  - ciprofloxacin (CIPRO) 250 MG tablet; Take 1 tablet (250 mg total) by mouth 2 (two) times daily for 3 days.  Dispense: 6 tablet; Refill: 0 - Urine Culture   RTC: As needed    Nolon NationsLachina Moore Deysy Schabel  MSN, FNP-C [facility] [address]  [place], [place] [address] [phone]

## 2017-11-11 ENCOUNTER — Encounter: Payer: Self-pay | Admitting: Family Medicine

## 2017-11-11 ENCOUNTER — Ambulatory Visit (HOSPITAL_COMMUNITY)
Admission: RE | Admit: 2017-11-11 | Discharge: 2017-11-11 | Disposition: A | Discharge status: Home / Self Care | Payer: Self-pay | Source: Ambulatory Visit | Attending: Family Medicine | Admitting: Family Medicine

## 2017-11-11 ENCOUNTER — Ambulatory Visit (INDEPENDENT_AMBULATORY_CARE_PROVIDER_SITE_OTHER): Payer: Self-pay | Admitting: Family Medicine

## 2017-11-11 VITALS — BP 130/62 | HR 90 | Temp 98.6°F | Resp 14 | Ht 59.0 in | Wt 169.0 lb

## 2017-11-11 DIAGNOSIS — G8929 Other chronic pain: Secondary | ICD-10-CM | POA: Insufficient documentation

## 2017-11-11 DIAGNOSIS — M545 Low back pain: Secondary | ICD-10-CM

## 2017-11-11 LAB — POCT URINE PREGNANCY: Preg Test, Ur: NEGATIVE

## 2017-11-11 MED ORDER — MELOXICAM 7.5 MG PO TABS: 7.5000 mg | ORAL_TABLET | Freq: Every day | ORAL | 0 refills | Status: DC

## 2017-11-11 MED ORDER — KETOROLAC TROMETHAMINE 60 MG/2ML IM SOLN
60.0000 mg | Freq: Once | INTRAMUSCULAR | Status: AC
  Administered 2017-11-11: 60 mg via INTRAMUSCULAR

## 2017-11-11 NOTE — Progress Notes (Signed)
Chief Complaint  Patient presents with  . Back Pain    Subjective:    Patient ID: Jillian Richardson, female    DOB: [birth year], 22 y.o.   MRN: [id number]  Back Pain  The current episode started more than 1 month ago. The problem is unchanged. The pain is present in the lumbar spine. The quality of the pain is described as aching. The pain does not radiate. The pain is at a severity of 4/10. The pain is moderate. The pain is worse during the day. The symptoms are aggravated by bending, lying down and sitting. Associated symptoms include dysuria. Pertinent negatives include no bladder incontinence, bowel incontinence, headaches, leg pain, numbness, paresis, paresthesias, pelvic pain, perianal numbness, weakness or weight loss. Risk factors include sedentary lifestyle and obesity. She has tried NSAIDs for the symptoms. The treatment provided mild relief.   Past Medical History:  Diagnosis Date  . Allergic rhinitis 10/23/2013  . Allergy    Social History   Socioeconomic History  . Marital status: Single    Spouse name: Not on file  . Number of children: Not on file  . Years of education: Not on file  . Highest education level: Not on file  Occupational History  . Not on file  Social Needs  . Financial resource strain: Not on file  . Food insecurity:    Worry: Not on file    Inability: Not on file  . Transportation needs:    Medical: Not on file    Non-medical: Not on file  Tobacco Use  . Smoking status: Never Smoker  . Smokeless tobacco: Never Used  Substance and Sexual Activity  . Alcohol use: No  . Drug use: No  . Sexual activity: Yes    Birth control/protection: None  Lifestyle  . Physical activity:    Days per week: Not on file    Minutes per session: Not on file  . Stress: Not on file  Relationships  . Social connections:    Talks on phone: Not on file    Gets together: Not on file    Attends religious service: Not on file    Active member of club or organization:  Not on file    Attends meetings of clubs or organizations: Not on file    Relationship status: Not on file  . Intimate partner violence:    Fear of current or ex partner: Not on file    Emotionally abused: Not on file    Physically abused: Not on file    Forced sexual activity: Not on file  Other Topics Concern  . Not on file  Social History Narrative  . Not on file   Immunization History  Administered Date(s) Administered  . DTaP 12/28/1995, 04/19/1996, 06/14/1996, 01/30/1997, 04/10/1999  . Hepatitis A, Ped/Adol-2 Dose 12/05/2013  . Hepatitis B 04/25/95, 12/28/1995, 04/19/1996  . HiB (PRP-OMP) 04/19/1996  . IPV 12/28/1995, 04/19/1996, 06/14/1996, 04/10/1999  . Influenza,inj,Quad PF,6+ Mos 10/04/2015, 07/20/2017  . MMR 12/28/1995, 04/10/1999  . Meningococcal Conjugate 12/05/2013  . Tdap 03/26/2007, 07/20/2017  . Varicella 12/05/2013     Review of Systems  Constitutional: Negative.  Negative for weight loss.  HENT: Negative.   Respiratory: Negative.   Cardiovascular: Negative.   Gastrointestinal: Negative for bowel incontinence.  Endocrine: Negative.   Genitourinary: Positive for dysuria. Negative for bladder incontinence, difficulty urinating, flank pain, menstrual problem, pelvic pain and urgency.  Musculoskeletal: Positive for back pain.  Allergic/Immunologic: Negative.   Neurological: Negative.  Negative for weakness,  numbness, headaches and paresthesias.  Hematological: Negative.        Objective:   Physical Exam  Constitutional: She is oriented to person, place, and time. She appears well-developed.  Eyes: Pupils are equal, round, and reactive to light.  Cardiovascular: Normal rate, regular rhythm, normal heart sounds and intact distal pulses.  Pulmonary/Chest: Effort normal and breath sounds normal.  Abdominal: Soft. Bowel sounds are normal. There is CVA tenderness.  Musculoskeletal:       Lumbar back: She exhibits decreased range of motion, pain and spasm.   Neurological: She is alert and oriented to person, place, and time.  Skin: Skin is warm and dry.  Psychiatric: She has a normal mood and affect. Her behavior is normal. Judgment and thought content normal.         BP 130/62 (BP Location: Right Arm, Patient Position: Sitting, Cuff Size: Large)   Pulse 90   Temp 98.6 F (37 C) (Oral)   Resp 14   Ht _0  (1.499 m)   Wt 169 lb (76.7 kg)   LMP 11/06/2017   SpO2 100%   BMI 34.13 kg/m  Assessment & Plan:   Chronic right-sided low back pain without sciatica - DG Lumbar Spine Complete; Future - POCT urine pregnancy - Urinalysis, Routine w reflex microscopic - ketorolac (TORADOL) injection 60 mg - meloxicam (MOBIC) 7.5 MG tablet; Take 1 tablet (7.5 mg total) by mouth daily.  Dispense: 30 tablet; Refill: 0 - Microscopic Examination   RTC: Will follow up by phone after reviewing back xray Donia Pounds  MSN, FNP-C Patient [facility] [address] [place], [place] [address] [phone]

## 2017-11-11 NOTE — Patient Instructions (Signed)
Will start a trial of Meloxicam 7.5 mg daily with food. Apply warm, moist compresses to low back as needed.    The patient was given clear instructions to go to ER or return to medical center if symptoms do not improve, worsen or new problems develop. The patient verbalized understanding.    Back Exercises If you have pain in your back, do these exercises 2-3 times each day or as told by your doctor. When the pain goes away, do the exercises once each day, but repeat the steps more times for each exercise (do more repetitions). If you do not have pain in your back, do these exercises once each day or as told by your doctor. Exercises Single Knee to Chest  Do these steps 3-5 times in a row for each leg: 1. Lie on your back on a firm bed or the floor with your legs stretched out. 2. Bring one knee to your chest. 3. Hold your knee to your chest by grabbing your knee or thigh. 4. Pull on your knee until you feel a gentle stretch in your lower back. 5. Keep doing the stretch for 10-30 seconds. 6. Slowly let go of your leg and straighten it.  Pelvic Tilt  Do these steps 5-10 times in a row: 1. Lie on your back on a firm bed or the floor with your legs stretched out. 2. Bend your knees so they point up to the ceiling. Your feet should be flat on the floor. 3. Tighten your lower belly (abdomen) muscles to press your lower back against the floor. This will make your tailbone point up to the ceiling instead of pointing down to your feet or the floor. 4. Stay in this position for 5-10 seconds while you gently tighten your muscles and breathe evenly.  Cat-Cow  Do these steps until your lower back bends more easily: 1. Get on your hands and knees on a firm surface. Keep your hands under your shoulders, and keep your knees under your hips. You may put padding under your knees. 2. Let your head hang down, and make your tailbone point down to the floor so your lower back is round like the back of a  cat. 3. Stay in this position for 5 seconds. 4. Slowly lift your head and make your tailbone point up to the ceiling so your back hangs low (sags) like the back of a cow. 5. Stay in this position for 5 seconds.  Press-Ups  Do these steps 5-10 times in a row: 1. Lie on your belly (face-down) on the floor. 2. Place your hands near your head, about shoulder-width apart. 3. While you keep your back relaxed and keep your hips on the floor, slowly straighten your arms to raise the top half of your body and lift your shoulders. Do not use your back muscles. To make yourself more comfortable, you may change where you place your hands. 4. Stay in this position for 5 seconds. 5. Slowly return to lying flat on the floor.  Bridges  Do these steps 10 times in a row: 1. Lie on your back on a firm surface. 2. Bend your knees so they point up to the ceiling. Your feet should be flat on the floor. 3. Tighten your butt muscles and lift your butt off of the floor until your waist is almost as high as your knees. If you do not feel the muscles working in your butt and the back of your thighs, slide your feet  1-2 inches farther away from your butt. 4. Stay in this position for 3-5 seconds. 5. Slowly lower your butt to the floor, and let your butt muscles relax.  If this exercise is too easy, try doing it with your arms crossed over your chest. Belly Crunches  Do these steps 5-10 times in a row: 1. Lie on your back on a firm bed or the floor with your legs stretched out. 2. Bend your knees so they point up to the ceiling. Your feet should be flat on the floor. 3. Cross your arms over your chest. 4. Tip your chin a little bit toward your chest but do not bend your neck. 5. Tighten your belly muscles and slowly raise your chest just enough to lift your shoulder blades a tiny bit off of the floor. 6. Slowly lower your chest and your head to the floor.  Back Lifts Do these steps 5-10 times in a row: 1. Lie  on your belly (face-down) with your arms at your sides, and rest your forehead on the floor. 2. Tighten the muscles in your legs and your butt. 3. Slowly lift your chest off of the floor while you keep your hips on the floor. Keep the back of your head in line with the curve in your back. Look at the floor while you do this. 4. Stay in this position for 3-5 seconds. 5. Slowly lower your chest and your face to the floor.  Contact a doctor if:  Your back pain gets a lot worse when you do an exercise.  Your back pain does not lessen 2 hours after you exercise. If you have any of these problems, stop doing the exercises. Do not do them again unless your doctor says it is okay. Get help right away if:  You have sudden, very bad back pain. If this happens, stop doing the exercises. Do not do them again unless your doctor says it is okay. This information is not intended to replace advice given to you by your health care provider. Make sure you discuss any questions you have with your health care provider. Document Released: 08/22/2010 Document Revised: 12/26/2015 Document Reviewed: 09/13/2014 Elsevier Interactive Patient Education  [employer].

## 2017-11-11 NOTE — Progress Notes (Signed)
neg

## 2017-11-12 ENCOUNTER — Telehealth: Payer: Self-pay

## 2017-11-12 LAB — URINALYSIS, ROUTINE W REFLEX MICROSCOPIC
BILIRUBIN UA: NEGATIVE
GLUCOSE, UA: NEGATIVE
Ketones, UA: NEGATIVE
NITRITE UA: NEGATIVE
Protein, UA: NEGATIVE
SPEC GRAV UA: 1.01 (ref 1.005–1.030)
UUROB: 0.2 mg/dL (ref 0.2–1.0)
pH, UA: 6 (ref 5.0–7.5)

## 2017-11-12 LAB — MICROSCOPIC EXAMINATION
Casts: NONE SEEN /lpf
Epithelial Cells (non renal): >10 /hpf — AB (ref 0–10)

## 2017-11-12 NOTE — Telephone Encounter (Signed)
-----   Message from Massie MaroonLachina M Hollis, [place] sent at 11/12/2017 12:19 PM EDT ----- Regarding: RE: lab results Yes, please  ----- Message ----- From: Loney HeringBatten, Makena Mcgrady E, LPN Sent: 6/96/29524/07/2018   8:18 AM To: Massie MaroonLachina M Hollis, FNP Subject: RE: lab results                                Culture can not be added. Do you want her to come back and recollect?  ----- Message ----- From: Massie MaroonHollis, Lachina M, FNP Sent: 11/12/2017   6:22 AM To: Loney HeringLaura E Eliyana Pagliaro, LPN Subject: lab results                                    Please call lab to add on urine culture.   Nolon NationsLachina Moore Hollis  MSN, FNP-C Patient Care [facility] [address]  [place], [place] [address] [phone]

## 2017-11-12 NOTE — Telephone Encounter (Signed)
Called and spoke with patient advised of xray being unremarkable. Thanks!

## 2017-11-12 NOTE — Telephone Encounter (Signed)
-----   Message from Massie MaroonLachina M Hollis, [place] sent at 11/11/2017  4:19 PM EDT ----- Regarding: lab results Please inform patient that back xray was unremarkable. No acute findings noted. Follow medication regimen as prescribed. Follow up in clinic as needed.   Nolon NationsLachina Moore Hollis  MSN, FNP-C Patient Care [facility] [address]  [place], [place] [address] [phone]

## 2017-11-12 NOTE — Telephone Encounter (Signed)
Called and left message asking patient to call back. Thanks!  

## 2017-12-01 ENCOUNTER — Encounter: Payer: Self-pay | Admitting: Family Medicine

## 2017-12-15 ENCOUNTER — Encounter: Payer: Self-pay | Admitting: Family Medicine

## 2017-12-29 ENCOUNTER — Encounter: Payer: Self-pay | Admitting: Family Medicine

## 2017-12-29 ENCOUNTER — Ambulatory Visit (INDEPENDENT_AMBULATORY_CARE_PROVIDER_SITE_OTHER): Payer: Self-pay | Admitting: Family Medicine

## 2017-12-29 VITALS — BP 119/63 | HR 79 | Temp 98.6°F | Resp 14 | Ht 59.0 in | Wt 167.0 lb

## 2017-12-29 DIAGNOSIS — G8929 Other chronic pain: Secondary | ICD-10-CM

## 2017-12-29 DIAGNOSIS — E669 Obesity, unspecified: Secondary | ICD-10-CM

## 2017-12-29 DIAGNOSIS — M545 Low back pain, unspecified: Secondary | ICD-10-CM

## 2017-12-29 MED ORDER — CYCLOBENZAPRINE HCL 5 MG PO TABS: 5.0000 mg | ORAL_TABLET | Freq: Every day | ORAL | 0 refills | Status: DC

## 2017-12-29 NOTE — Patient Instructions (Signed)
For low back pain, recommend ibuprofen 600 mg every 8 hours as needed for mild to moderate pain.  Take medication with food.  We will also continue Flexeril 5 mg as needed for back spasms.      Her calluses, recommend you increase shoe support by investing in shoes with added support such as [employer], or [employer] or you can invest and over-the-counter inserts.    To your discharge and

## 2017-12-29 NOTE — Progress Notes (Signed)
Subjective:    Patient ID: Jillian Richardson, female    DOB: January 16, 1996, 22 y.o.   MRN: [id number]  Jillian Richardson, a 22 year old female with a history of recurrent back pain presents for follow-up.  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  She states that she often lifts patient using her equipment and has low back pain at the end of shift.  The patient also endorses periodic foot pain.  Current pain intensity is 2-3/10.  Patient has not attempted any over-the-counter analgesics in the past 24 hours  Back Pain  This is a chronic problem. The problem occurs intermittently. The quality of the pain is described as aching. The pain is at a severity of 2/10. The pain is mild. The pain is worse during the night.     Past Medical History:  Diagnosis Date  . Allergic rhinitis 10/23/2013  . Allergy    Social History   Socioeconomic History  . Marital status: Single    Spouse name: Not on file  . Number of children: Not on file  . Years of education: Not on file  . Highest education level: Not on file  Occupational History  . Not on file  Social Needs  . Financial resource strain: Not on file  . Food insecurity:    Worry: Not on file    Inability: Not on file  . Transportation needs:    Medical: Not on file    Non-medical: Not on file  Tobacco Use  . Smoking status: Never Smoker  . Smokeless tobacco: Never Used  Substance and Sexual Activity  . Alcohol use: No  . Drug use: No  . Sexual activity: Yes    Birth control/protection: None  Lifestyle  . Physical activity:    Days per week: Not on file    Minutes per session: Not on file  . Stress: Not on file  Relationships  . Social connections:    Talks on phone: Not on file    Gets together: Not on file    Attends religious service: Not on file    Active member of club or organization: Not on file    Attends meetings of clubs or organizations: Not on file    Relationship status: Not on file  .  Intimate partner violence:    Fear of current or ex partner: Not on file    Emotionally abused: Not on file    Physically abused: Not on file    Forced sexual activity: Not on file  Other Topics Concern  . Not on file  Social History Narrative  . Not on file   Review of Systems  HENT: Negative.   Eyes: Negative.   Respiratory: Negative.   Cardiovascular: Negative.   Gastrointestinal: Negative.   Endocrine: Negative.   Genitourinary: Negative.   Musculoskeletal: Positive for back pain.  Allergic/Immunologic: Negative.   Neurological: Negative.   Hematological: Negative.   Psychiatric/Behavioral: Negative.        Objective:   Physical Exam  Constitutional: She appears well-developed and well-nourished.  Eyes: Pupils are equal, round, and reactive to light.  Neck: Normal range of motion.  Cardiovascular: Normal rate and regular rhythm.  Pulmonary/Chest: Effort normal and breath sounds normal.  Abdominal: Soft. Bowel sounds are normal.  Skin: Skin is warm and dry.  Psychiatric: She has a normal mood and affect. Her behavior is normal. Judgment and thought content normal.         BP 119/63 (  BP Location: Left Arm, Patient Position: Sitting, Cuff Size: Normal)   Pulse 79   Temp 98.6 F (37 C) (Oral)   Resp 14   Ht  (1.499 m)   Wt 167 lb (75.8 kg)   LMP 12/08/2017   SpO2 100%   BMI 33.73 kg/m  Assessment & Plan:  1. Chronic bilateral low back pain without sciatica Continue Ibuprofen 800 mg every 8 hours as needed for mild to moderate pain.  - cyclobenzaprine (FLEXERIL) 5 MG tablet; Take 1 tablet (5 mg total) by mouth at bedtime.  Dispense: 15 tablet; Refill: 0  2. Obesity (BMI 30-39.9) Recommend a lowfat, low carbohydrate diet divided over 5-6 small meals, increase water intake to 6-8 glasses, and 150 minutes per week of cardiovascular exercise.     RTC: As needed    The patient was given clear instructions to go to ER or return to medical center if  symptoms do not improve, worsen or new problems develop. The patient verbalized understanding.      Nolon Nations  MSN, FNP-C Patient Care [facility] [address] [place], [place] [address] [phone]

## 2018-01-31 ENCOUNTER — Encounter: Payer: Self-pay | Admitting: Family Medicine

## 2018-01-31 ENCOUNTER — Ambulatory Visit (INDEPENDENT_AMBULATORY_CARE_PROVIDER_SITE_OTHER): Payer: Self-pay | Admitting: Family Medicine

## 2018-01-31 VITALS — BP 131/57 | HR 75 | Temp 98.4°F | Resp 16 | Ht 59.0 in | Wt 167.0 lb

## 2018-01-31 DIAGNOSIS — T39395A Adverse effect of other nonsteroidal anti-inflammatory drugs [NSAID], initial encounter: Secondary | ICD-10-CM

## 2018-01-31 DIAGNOSIS — K219 Gastro-esophageal reflux disease without esophagitis: Secondary | ICD-10-CM

## 2018-01-31 DIAGNOSIS — K259 Gastric ulcer, unspecified as acute or chronic, without hemorrhage or perforation: Secondary | ICD-10-CM

## 2018-01-31 NOTE — Progress Notes (Signed)
Subjective:    Patient ID: Jillian Richardson, female    DOB: [birth year], 22 y.o.   MRN: [id number]  [place], a 22 year old female with history of chronic back pain presents for a post hospital follow up. Patient was evaluated in the emergency department on 01/26/2018 for epigastric pain. She says that she was advised to discontinue NSAIDs for chronic back pain and prescribed pantoprazole. Jillian Richardson says that symptoms have mostly resolved on current medication regimen.   Gastroesophageal Reflux  She complains of abdominal pain. She reports no belching, no chest pain, no choking, no coughing, no early satiety, no heartburn, no nausea, no sore throat or no wheezing. The problem has been gradually improving. The symptoms are aggravated by medications. She has tried a diet change for the symptoms. The treatment provided no relief.   Past Medical History:  Diagnosis Date  . Allergic rhinitis 10/23/2013  . Allergy    Social History   Socioeconomic History  . Marital status: Single    Spouse name: Not on file  . Number of children: Not on file  . Years of education: Not on file  . Highest education level: Not on file  Occupational History  . Not on file  Social Needs  . Financial resource strain: Not on file  . Food insecurity:    Worry: Not on file    Inability: Not on file  . Transportation needs:    Medical: Not on file    Non-medical: Not on file  Tobacco Use  . Smoking status: Never Smoker  . Smokeless tobacco: Never Used  Substance and Sexual Activity  . Alcohol use: No  . Drug use: No  . Sexual activity: Yes    Birth control/protection: None  Lifestyle  . Physical activity:    Days per week: Not on file    Minutes per session: Not on file  . Stress: Not on file  Relationships  . Social connections:    Talks on phone: Not on file    Gets together: Not on file    Attends religious service: Not on file    Active member of club or organization: Not on file   Attends meetings of clubs or organizations: Not on file    Relationship status: Not on file  . Intimate partner violence:    Fear of current or ex partner: Not on file    Emotionally abused: Not on file    Physically abused: Not on file    Forced sexual activity: Not on file  Other Topics Concern  . Not on file  Social History Narrative  . Not on file   Immunization History  Administered Date(s) Administered  . DTaP 12/28/1995, 04/19/1996, 06/14/1996, 01/30/1997, 04/10/1999  . Hepatitis A, Ped/Adol-2 Dose 12/05/2013  . Hepatitis B 08-20-94, 12/28/1995, 04/19/1996  . HiB (PRP-OMP) 04/19/1996  . IPV 12/28/1995, 04/19/1996, 06/14/1996, 04/10/1999  . Influenza,inj,Quad PF,6+ Mos 10/04/2015, 07/20/2017  . MMR 12/28/1995, 04/10/1999  . Meningococcal Conjugate 12/05/2013  . Tdap 03/26/2007, 07/20/2017  . Varicella 12/05/2013     Review of Systems  Constitutional: Negative.   HENT: Negative.  Negative for sore throat.   Eyes: Negative.   Respiratory: Negative for cough, choking and wheezing.   Cardiovascular: Negative for chest pain.  Gastrointestinal: Positive for abdominal pain. Negative for heartburn and nausea.  Endocrine: Negative.  Negative for cold intolerance.  Genitourinary: Negative.   Musculoskeletal: Negative.   Skin: Negative.   Allergic/Immunologic: Negative.   Neurological: Negative.   Hematological:  Negative.   Psychiatric/Behavioral: Negative.        Objective:   Physical Exam  Constitutional: She is oriented to person, place, and time. She appears well-developed and well-nourished.  HENT:  Head: Normocephalic and atraumatic.  Cardiovascular: Normal rate, regular rhythm, normal heart sounds and intact distal pulses.  Pulmonary/Chest: Effort normal and breath sounds normal.  Abdominal: Soft. Bowel sounds are normal.  Musculoskeletal: Normal range of motion.  Neurological: She is alert and oriented to person, place, and time.  Skin: Skin is warm and  dry.  Psychiatric: She has a normal mood and affect. Her behavior is normal. Judgment and thought content normal.       BP (!) 131/57 (BP Location: Left Arm, Patient Position: Sitting, Cuff Size: Normal)   Pulse 75   Temp 98.4 F (36.9 C) (Oral)   Resp 16   Ht _0  (1.499 m)   Wt 167 lb (75.8 kg)   LMP 01/08/2018   SpO2 100%   BMI 33.73 kg/m  Assessment & Plan:  1. Gastroesophageal reflux disease without esophagitis Continue patoprazole 20 mg daily. Discussed diet at length.  The patient is asked to make an attempt to improve diet and exercise patterns to aid in medical management of this problem.   2. Stomach ulcer due to nonsteroidal anti-inflammatory drug (NSAID) Refrain from using OTC NSAIDs, patient expressed understanding    The patient was given clear instructions to go to ER or return to medical center if symptoms do not improve, worsen or new problems develop. The patient verbalized understanding.     Donia Pounds  MSN, FNP-C Patient [facility] [address] [place], [place] [address] [phone]

## 2018-01-31 NOTE — Patient Instructions (Addendum)
Refrain from taking NSAIDs or OTC Ibuprofen or naproxen.   For mild to moderate pain, take Tylenol 500 mg every 6 hours    Peptic Ulcer A peptic ulcer is a painful sore in the lining of your esophagus, stomach, or the first part of your small intestine. You may have pain in the area between your chest and your belly button. The most common causes of an ulcer are:  An infection.  Using certain pain medicines too often or too much.  Follow these instructions at home:  Avoid alcohol.  Avoid caffeine.  Do not use any tobacco products. These include cigarettes, chewing tobacco, and e-cigarettes. If you need help quitting, ask your doctor.  Take over-the-counter and prescription medicines only as told by your doctor. Do not stop or change your medicines unless you talk with your doctor about it first.  Keep all follow-up visits as told by your doctor. This is important. Contact a doctor if:  You do not get better in 7 days after you start treatment.  You keep having an upset stomach (indigestion) or heartburn. Get help right away if:  You have sudden, sharp pain in your belly (abdomen).  You have lasting belly pain.  You have bloody poop (stool) or black, tarry poop.  You throw up (vomit) blood. It may look like coffee grounds.  You feel light-headed or feel like you may pass out (faint).  You get weak.  You get sweaty or feel sticky and cold to the touch (clammy). This information is not intended to replace advice given to you by your health care provider. Make sure you discuss any questions you have with your health care provider. Document Released: 10/14/2009 Document Revised: 12/04/2015 Document Reviewed: 04/20/2015 Elsevier Interactive Patient Education  [employer]2018 [employer]

## 2018-04-28 ENCOUNTER — Inpatient Hospital Stay (HOSPITAL_COMMUNITY): Payer: Medicaid Other

## 2018-04-28 ENCOUNTER — Inpatient Hospital Stay (HOSPITAL_COMMUNITY)
Admission: AD | Admit: 2018-04-28 | Discharge: 2018-04-28 | Disposition: A | Discharge status: Home / Self Care | Payer: Medicaid Other | Source: Ambulatory Visit | Attending: Obstetrics and Gynecology | Admitting: Obstetrics and Gynecology

## 2018-04-28 ENCOUNTER — Encounter (HOSPITAL_COMMUNITY): Payer: Self-pay | Admitting: *Deleted

## 2018-04-28 DIAGNOSIS — O200 Threatened abortion: Secondary | ICD-10-CM

## 2018-04-28 DIAGNOSIS — O209 Hemorrhage in early pregnancy, unspecified: Secondary | ICD-10-CM | POA: Insufficient documentation

## 2018-04-28 DIAGNOSIS — Z3A01 Less than 8 weeks gestation of pregnancy: Secondary | ICD-10-CM | POA: Insufficient documentation

## 2018-04-28 DIAGNOSIS — O283 Abnormal ultrasonic finding on antenatal screening of mother: Secondary | ICD-10-CM

## 2018-04-28 DIAGNOSIS — O3680X0 Pregnancy with inconclusive fetal viability, not applicable or unspecified: Secondary | ICD-10-CM

## 2018-04-28 LAB — CBC
HCT: 40.6 % (ref 36.0–46.0)
Hemoglobin: 13.4 g/dL (ref 12.0–15.0)
MCH: 31.8 pg (ref 26.0–34.0)
MCHC: 33 g/dL (ref 30.0–36.0)
MCV: 96.4 fL (ref 78.0–100.0)
PLATELETS: 276 10*3/uL (ref 150–400)
RBC: 4.21 MIL/uL (ref 3.87–5.11)
RDW: 12.6 % (ref 11.5–15.5)
WBC: 9.8 10*3/uL (ref 4.0–10.5)

## 2018-04-28 LAB — URINALYSIS, ROUTINE W REFLEX MICROSCOPIC
Bacteria, UA: NONE SEEN
Bilirubin Urine: NEGATIVE
Glucose, UA: NEGATIVE mg/dL
KETONES UR: NEGATIVE mg/dL
Nitrite: NEGATIVE
PROTEIN: 30 mg/dL — AB
Specific Gravity, Urine: 1.026 (ref 1.005–1.030)
pH: 7 (ref 5.0–8.0)

## 2018-04-28 LAB — HCG, QUANTITATIVE, PREGNANCY: hCG, Beta Chain, Quant, S: 318 m[IU]/mL — ABNORMAL HIGH (ref <5)

## 2018-04-28 LAB — POCT PREGNANCY, URINE: Preg Test, Ur: POSITIVE — AB

## 2018-04-28 LAB — ABO/RH: ABO/RH(D): O POS

## 2018-04-28 NOTE — MAU Note (Signed)
Pt states she went to [facility] twice yesterday because she was bleeding & cramping.  Pos HPT on 9/15.  States her BHCG was lower the second time, had U/S & nothing was seen in uterus.  Bleeding continues today, having mild cramping.

## 2018-04-28 NOTE — MAU Provider Note (Signed)
History     CSN: [id number]  Arrival date and time: 04/28/18 4098   First Provider Initiated Contact with Patient 04/28/18 1320      Chief Complaint  Patient presents with  . Vaginal Bleeding  . Abdominal Pain   HPI [facility] 22 y.o. [redacted]w[redacted]d  Comes to MAU today with vaginal bleeding.  Has been evaluated at [facility] twice for bleeding in pregnancy.  She does not yet have an OB provider.  She was told she may be having a miscarriage but wanted to be evaluated again.  There are no records in Care Everywhere and she lives in [place].  She wants to be evaluated here today and come for follow up here in [place].  Has been waiting in the lobby for over 3 hours.  Had a pelvic exam at [facility] and had an ultrasound.  OB History    Gravida  1   Para      Term      Preterm      AB      Living        SAB      TAB      Ectopic      Multiple      Live Births              Past Medical History:  Diagnosis Date  . Allergic rhinitis 10/23/2013  . Allergy     Past Surgical History:  Procedure Laterality Date  . NO PAST SURGERIES      Family History  Problem Relation Age of Onset  . Diabetes Mother   . Diabetes Father     Social History   Tobacco Use  . Smoking status: Never Smoker  . Smokeless tobacco: Never Used  Substance Use Topics  . Alcohol use: No  . Drug use: No    Allergies: No Known Allergies  Medications Prior to Admission  Medication Sig Dispense Refill Last Dose  . ciprofloxacin (CIPRO) 500 MG tablet Take 500 mg by mouth 2 (two) times daily.   Taking  . cyclobenzaprine (FLEXERIL) 5 MG tablet Take 1 tablet (5 mg total) by mouth at bedtime. 15 tablet 0 Taking  . pantoprazole (PROTONIX) 20 MG tablet Take 20 mg by mouth daily.   Taking    Review of Systems  Constitutional: Negative for fever.  Gastrointestinal: Negative for diarrhea.  Genitourinary: Positive for vaginal bleeding. Negative for dysuria and vaginal  discharge.   Physical Exam   Blood pressure 114/65, pulse 77, temperature 98.6 F (37 C), temperature source Oral, resp. rate 16, height 4\' 11"  (1.499 m), weight 75.8 kg, last menstrual period 03/10/2018.  Physical Exam  Nursing note and vitals reviewed. Constitutional: She is oriented to person, place, and time. She appears well-developed and well-nourished.  HENT:  Head: Normocephalic.  Eyes: EOM are normal.  Neck: Neck supple.  GI: Soft. There is no tenderness. There is no rebound and no guarding.  Musculoskeletal: Normal range of motion.  Neurological: She is alert and oriented to person, place, and time.  Skin: Skin is warm and dry.  Psychiatric: She has a normal mood and affect.  Pelvic exam done at [facility] and not repeated here.  MAU Course  Procedures Results for orders placed or performed during the hospital encounter of 04/28/18 (from the past 24 hour(s))  Urinalysis, Routine w reflex microscopic     Status: Abnormal   Collection Time: 04/28/18 11:01 AM  Result Value Ref Range  Color, Urine YELLOW YELLOW   APPearance HAZY (A) CLEAR   Specific Gravity, Urine 1.026 1.005 - 1.030   pH 7.0 5.0 - 8.0   Glucose, UA NEGATIVE NEGATIVE mg/dL   Hgb urine dipstick LARGE (A) NEGATIVE   Bilirubin Urine NEGATIVE NEGATIVE   Ketones, ur NEGATIVE NEGATIVE mg/dL   Protein, ur 30 (A) NEGATIVE mg/dL   Nitrite NEGATIVE NEGATIVE   Leukocytes, UA MODERATE (A) NEGATIVE   RBC / HPF 21-50 0 - 5 RBC/hpf   WBC, UA >50 (H) 0 - 5 WBC/hpf   Bacteria, UA NONE SEEN NONE SEEN   Squamous Epithelial / LPF 0-5 0 - 5   Mucus PRESENT   Culture, OB Urine     Status: None (Preliminary result)   Collection Time: 04/28/18 11:09 AM  Result Value Ref Range   Specimen Description      URINE, CLEAN CATCH Performed at [facility], [address] [place], [place] [address]    Special Requests      NONE Performed at [facility], [address] [place], [place] [address]     Culture PENDING    Report Status PENDING   Pregnancy, urine POC     Status: Abnormal   Collection Time: 04/28/18 11:11 AM  Result Value Ref Range   Preg Test, Ur POSITIVE (A) NEGATIVE  CBC     Status: None   Collection Time: 04/28/18  1:34 PM  Result Value Ref Range   WBC 9.8 4.0 - 10.5 K/uL   RBC 4.21 3.87 - 5.11 MIL/uL   Hemoglobin 13.4 12.0 - 15.0 g/dL   HCT 09.8 11.9 - 14.7 %   MCV 96.4 78.0 - 100.0 fL   MCH 31.8 26.0 - 34.0 pg   MCHC 33.0 30.0 - 36.0 g/dL   RDW 82.9 56.2 - 13.0 %   Platelets 276 150 - 400 K/uL  hCG, quantitative, pregnancy     Status: Abnormal   Collection Time: 04/28/18  1:34 PM  Result Value Ref Range   hCG, Beta Chain, Quant, S 318 (H) <5 mIU/mL  ABO/Rh     Status: None (Preliminary result)   Collection Time: 04/28/18  1:34 PM  Result Value Ref Range   ABO/RH(D)      O POS Performed at [facility], [address] [place], [place] [address]    CLINICAL DATA:  Vaginal bleeding in first trimester of pregnancy; quantitative beta HCG = 318  EXAM: OBSTETRIC <14 WK [place] AND TRANSVAGINAL OB US  TECHNIQUE: Both transabdominal and transvaginal ultrasound examinations were performed for complete evaluation of the gestation as well as the maternal uterus, adnexal regions, and pelvic cul-de-sac. Transvaginal technique was performed to assess early pregnancy.  COMPARISON:  04/27/2018  FINDINGS: Intrauterine gestational sac: None visualize  Yolk sac:  N/A  Embryo:  N/A  Cardiac Activity: N/A  Heart Rate: N/A  bpm  MSD:   mm    w     d  CRL:    mm    w    d                  [place] EDC:  Subchorionic hemorrhage:  N/A  Maternal uterus/adnexae:  Normal uterine morphology without mass.  Heterogeneous appearance of the endometrial complex up to 8 mm thick.  No distention of sac or endometrial fluid seen.  RIGHT ovary normal size and morphology, 2.4 x 2.2 x 1.8 cm.  LEFT ovary normal size and morphology, 2.6 x 1.5  x 1.6  cm.  Small amount of nonspecific free pelvic fluid.  No adnexal masses.  IMPRESSION: No intrauterine gestational sac identified.  Questionable visualization of a gestational sac on the previous exam, not definitive.  Findings are compatible with pregnancy of unknown location.  Differential diagnosis includes early intrauterine pregnancy too early to visualize, spontaneous abortion, and ectopic pregnancy.  Serial quantitative beta hCG and or followup ultrasound recommended to definitively exclude ectopic pregnancy.    MDM Reviewed with client that likely this pregnancy will end in miscarrige.  Reviewed lab results and ultrasound results with client.  Ultrasound was able to see ultrasound done at [facility] on yesterday.  Client tearful as she was hoping the results she got at [facility] were not accurate.  Assessment and Plan  Pregnancy of unknown anatomic location Vaginal bleeding in pregnancy, first trimester  Plan Return to MAU with severe vaginal bleeding or severe abdominal pain.  Patient informed that at this time the pregnancy cannot be confirmed to be in the uterus as no yolk sac is visualized on ultrasound.  Advised to return with worsening symptoms as an ectopic pregnancy cannot be ruled out and this could be a life threatening condition. Pelvic rest - no tampons, no douching, no sex, nothing in the vagina. No heavy lifting or strenuous exercise. Strongly advised to return on Saturday to MAU for quant and wait for the results Signed and held order done.   Jillian Richardson L Lachell Rochette 04/28/2018, 1:30 PM

## 2018-04-28 NOTE — Discharge Instructions (Signed)
Your hormone levels seem to be falling. Return on Saturday at 3 pm for repeat blood work.  Expect to be here for at least 2 hours. Increasing vaginal bleeding would not be unexpected if this is a miscarriage. Pelvic rest - no tampons, no douching, no sex, nothing in the vagina. No heavy lifting or strenuous exercise.

## 2018-04-29 LAB — CULTURE, OB URINE

## 2018-04-29 LAB — RPR: RPR Ser Ql: NONREACTIVE

## 2018-04-29 LAB — HIV ANTIBODY (ROUTINE TESTING W REFLEX): HIV SCREEN 4TH GENERATION: NONREACTIVE

## 2018-04-30 ENCOUNTER — Inpatient Hospital Stay (HOSPITAL_COMMUNITY)
Admission: AD | Admit: 2018-04-30 | Discharge: 2018-04-30 | Disposition: A | Discharge status: Home / Self Care | Payer: Medicaid Other | Source: Ambulatory Visit | Attending: Family Medicine | Admitting: Family Medicine

## 2018-04-30 DIAGNOSIS — O039 Complete or unspecified spontaneous abortion without complication: Secondary | ICD-10-CM | POA: Diagnosis present

## 2018-04-30 LAB — HCG, QUANTITATIVE, PREGNANCY: HCG, BETA CHAIN, QUANT, S: 92 m[IU]/mL — AB (ref <5)

## 2018-04-30 NOTE — Discharge Instructions (Signed)

## 2018-04-30 NOTE — MAU Note (Signed)
Jillian Richardson is a 22 y.o. at [redacted]w[redacted]d here in MAU reporting: for follow HCG levels. Still reports lower abdominal cramping and vaginal bleeding. However she states that it is similar to her previous visit and has not worsened. Pain score: 7/10 Vitals:   04/30/18 1435  BP: (!) 118/58  Pulse: 82  Resp: 16  Temp: 98.5 F (36.9 C)  SpO2: 100%      Lab orders placed from triage: none

## 2018-04-30 NOTE — MAU Provider Note (Signed)
[name]  is a 22 y.o. G1P0  at [redacted]w[redacted]d who presents to MAU today for follow-up quant hCG. The patient reports continued abdominal cramping & light vaginal bleeding. Same as previous visit.   BP (!) 118/58 (BP Location: Right Arm)   Pulse 82   Temp 98.5 F (36.9 C) (Oral)   Resp 16   Ht 4\' 11"  (1.499 m)   Wt 76.2 kg   LMP 03/10/2018   SpO2 100% Comment: ra  BMI 33.94 kg/m   GENERAL: Well-developed, well-nourished female in no acute distress.  HEENT: Normocephalic, atraumatic.   LUNGS: Effort normal HEART: Regular rate  SKIN: Warm, dry and without erythema PSYCH: Normal mood and affect   A: 1. Miscarriage   -Drop in hcg 318>92   P: Discharge home Msg to [facility] for f/u HCG & SAB visit Discussed reasons to return to MAU  Judeth Horn, NP  04/30/2018 4:32 PM

## 2018-05-02 ENCOUNTER — Telehealth: Payer: Self-pay | Admitting: Family Medicine

## 2018-05-02 NOTE — Telephone Encounter (Signed)
Called patient in regards of getting her scheduled for lab work for non-stat hcg due to a miscarriage and patient stated that she was told that she was told that she could follow-up with her PCP on this matter and if they could not do it then she would give the office a call back.

## 2018-05-04 ENCOUNTER — Ambulatory Visit: Payer: Self-pay | Admitting: Family Medicine

## 2018-05-06 ENCOUNTER — Ambulatory Visit (INDEPENDENT_AMBULATORY_CARE_PROVIDER_SITE_OTHER): Payer: Medicaid Other | Admitting: Family Medicine

## 2018-05-06 ENCOUNTER — Encounter: Payer: Self-pay | Admitting: Family Medicine

## 2018-05-06 VITALS — BP 110/58 | HR 80 | Temp 98.7°F | Resp 16 | Ht 59.0 in | Wt 167.0 lb

## 2018-05-06 DIAGNOSIS — O039 Complete or unspecified spontaneous abortion without complication: Secondary | ICD-10-CM | POA: Diagnosis not present

## 2018-05-06 DIAGNOSIS — Z23 Encounter for immunization: Secondary | ICD-10-CM | POA: Diagnosis not present

## 2018-05-06 NOTE — Patient Instructions (Signed)

## 2018-05-06 NOTE — Progress Notes (Signed)
  [facility]   Progress Note: General Provider: Mike Gip, FNP  SUBJECTIVE:   Jillian Richardson is a 22 y.o. female who  has a past medical history of Allergic rhinitis (10/23/2013) and Allergy.. Patient presents today for Hospitalization Follow-up (follow up from Miscarriage )  Patient presents for follow up after miscarriage. Patient reports having bleeding and cramping. Was reported the fetus was 6 weeks. Denies bleeding, abdominal cramping fever or chills.  Patient states that she is doing well emotionally.  She has support of her family and her boyfriend. Review of Systems  Constitutional: Negative.   HENT: Negative.   Eyes: Negative.   Respiratory: Negative.   Cardiovascular: Negative.   Gastrointestinal: Negative.   Genitourinary: Negative.   Musculoskeletal: Negative.   Skin: Negative.   Neurological: Negative.   Psychiatric/Behavioral: Negative.      OBJECTIVE: BP (!) 110/58 (BP Location: Left Arm, Patient Position: Sitting, Cuff Size: Large)   Pulse 80   Temp 98.7 F (37.1 C) (Oral)   Resp 16   Ht 4\' 11"  (1.499 m)   Wt 167 lb (75.8 kg)   LMP 03/10/2018 Comment: had a miscarrage 04/28/2018  SpO2 100%   Breastfeeding? Unknown   BMI 33.73 kg/m   Physical Exam  Constitutional: She is oriented to person, place, and time. She appears well-developed and well-nourished. No distress.  HENT:  Head: Normocephalic and atraumatic.  Eyes: Pupils are equal, round, and reactive to light. Conjunctivae and EOM are normal.  Neck: Normal range of motion.  Cardiovascular: Normal rate, regular rhythm, normal heart sounds and intact distal pulses.  Pulmonary/Chest: Effort normal and breath sounds normal. No respiratory distress.  Abdominal: Soft. Bowel sounds are normal. She exhibits no distension.  Musculoskeletal: Normal range of motion.  Neurological: She is alert and oriented to person, place, and time.  Skin: Skin is warm  and dry.  Psychiatric: She has a normal mood and affect. Her behavior is normal. Thought content normal.  Nursing note and vitals reviewed.   ASSESSMENT/PLAN:   1. Spontaneous abortion Patient is requesting STD screening.  - hCG, quantitative, pregnancy - CBC with Differential - Comprehensive metabolic panel - Iron, TIBC and Ferritin Panel - Chlamydia/GC NAA, Confirmation  2. Need for immunization against influenza Influenza vaccination given in the office visit today. - Flu Vaccine QUAD 36+ mos IM     The patient was given clear instructions to go to ER or return to medical center if symptoms do not improve, worsen or new problems develop. The patient verbalized understanding and agreed with plan of care.   [name]. Riley Lam, FNP-BC [facility] [address] [place], [place] [address] [phone]     This note has been created with Dragon speech recognition software and smart phrase technology. Any transcriptional errors are unintentional.

## 2018-05-07 LAB — IRON,TIBC AND FERRITIN PANEL
Ferritin: 35 ng/mL (ref 15–150)
Iron Saturation: 14 % — ABNORMAL LOW (ref 15–55)
Iron: 54 ug/dL (ref 27–159)
Total Iron Binding Capacity: 392 ug/dL (ref 250–450)
UIBC: 338 ug/dL (ref 131–425)

## 2018-05-07 LAB — CBC WITH DIFFERENTIAL/PLATELET
Basophils Absolute: 0.1 10*3/uL (ref 0.0–0.2)
Basos: 1 %
EOS (ABSOLUTE): 0.2 10*3/uL (ref 0.0–0.4)
Eos: 3 %
Hematocrit: 37.3 % (ref 34.0–46.6)
Hemoglobin: 12.3 g/dL (ref 11.1–15.9)
Immature Grans (Abs): 0 10*3/uL (ref 0.0–0.1)
Immature Granulocytes: 0 %
Lymphocytes Absolute: 1.9 10*3/uL (ref 0.7–3.1)
Lymphs: 24 %
MCH: 31.4 pg (ref 26.6–33.0)
MCHC: 33 g/dL (ref 31.5–35.7)
MCV: 95 fL (ref 79–97)
Monocytes Absolute: 0.5 10*3/uL (ref 0.1–0.9)
Monocytes: 6 %
Neutrophils Absolute: 5.1 10*3/uL (ref 1.4–7.0)
Neutrophils: 66 %
Platelets: 290 10*3/uL (ref 150–450)
RBC: 3.92 x10E6/uL (ref 3.77–5.28)
RDW: 11.6 % — ABNORMAL LOW (ref 12.3–15.4)
WBC: 7.8 10*3/uL (ref 3.4–10.8)

## 2018-05-07 LAB — COMPREHENSIVE METABOLIC PANEL
ALT: 13 IU/L (ref 0–32)
AST: 13 IU/L (ref 0–40)
Albumin/Globulin Ratio: 1.7 (ref 1.2–2.2)
Albumin: 4.3 g/dL (ref 3.5–5.5)
Alkaline Phosphatase: 80 IU/L (ref 39–117)
BUN/Creatinine Ratio: 25 — ABNORMAL HIGH (ref 9–23)
BUN: 14 mg/dL (ref 6–20)
Bilirubin Total: <0.2 mg/dL (ref 0.0–1.2)
CO2: 21 mmol/L (ref 20–29)
Calcium: 9.6 mg/dL (ref 8.7–10.2)
Chloride: 100 mmol/L (ref 96–106)
Creatinine, Ser: 0.57 mg/dL (ref 0.57–1.00)
GFR calc Af Amer: 151 mL/min/{1.73_m2} (ref >59)
GFR calc non Af Amer: 131 mL/min/{1.73_m2} (ref >59)
Globulin, Total: 2.5 g/dL (ref 1.5–4.5)
Glucose: 76 mg/dL (ref 65–99)
Potassium: 4 mmol/L (ref 3.5–5.2)
Sodium: 139 mmol/L (ref 134–144)
Total Protein: 6.8 g/dL (ref 6.0–8.5)

## 2018-05-08 LAB — CHLAMYDIA/GC NAA, CONFIRMATION
Chlamydia trachomatis, NAA: NEGATIVE
Neisseria gonorrhoeae, NAA: NEGATIVE

## 2018-07-16 ENCOUNTER — Emergency Department (HOSPITAL_COMMUNITY)
Admission: EM | Admit: 2018-07-16 | Discharge: 2018-07-16 | Disposition: A | Discharge status: Home / Self Care | Payer: Medicaid Other | Attending: Emergency Medicine | Admitting: Emergency Medicine

## 2018-07-16 ENCOUNTER — Other Ambulatory Visit: Payer: Self-pay

## 2018-07-16 ENCOUNTER — Encounter (HOSPITAL_COMMUNITY): Payer: Self-pay | Admitting: Emergency Medicine

## 2018-07-16 ENCOUNTER — Emergency Department (HOSPITAL_COMMUNITY): Payer: Medicaid Other

## 2018-07-16 DIAGNOSIS — Z3A01 Less than 8 weeks gestation of pregnancy: Secondary | ICD-10-CM | POA: Diagnosis not present

## 2018-07-16 DIAGNOSIS — Z79899 Other long term (current) drug therapy: Secondary | ICD-10-CM | POA: Insufficient documentation

## 2018-07-16 DIAGNOSIS — O209 Hemorrhage in early pregnancy, unspecified: Secondary | ICD-10-CM | POA: Diagnosis present

## 2018-07-16 DIAGNOSIS — O200 Threatened abortion: Secondary | ICD-10-CM | POA: Insufficient documentation

## 2018-07-16 LAB — CBC
HEMATOCRIT: 40.1 % (ref 36.0–46.0)
HEMOGLOBIN: 12.8 g/dL (ref 12.0–15.0)
MCH: 31 pg (ref 26.0–34.0)
MCHC: 31.9 g/dL (ref 30.0–36.0)
MCV: 97.1 fL (ref 80.0–100.0)
Platelets: 262 10*3/uL (ref 150–400)
RBC: 4.13 MIL/uL (ref 3.87–5.11)
RDW: 12.3 % (ref 11.5–15.5)
WBC: 11.3 10*3/uL — AB (ref 4.0–10.5)
nRBC: 0 % (ref 0.0–0.2)

## 2018-07-16 LAB — HCG, QUANTITATIVE, PREGNANCY: HCG, BETA CHAIN, QUANT, S: 4022 m[IU]/mL — AB (ref <5)

## 2018-07-16 NOTE — ED Provider Notes (Signed)
[facility] EMERGENCY DEPARTMENT Provider Note   CSN: [id number] Arrival date & time: 07/16/18  1914     History   Chief Complaint Chief Complaint  Patient presents with  . Vaginal Bleeding    HPI Jillian Richardson is a 22 y.o. female.  Patient c/o vaginal bleeding onset today. Bleeding acute onset, episodic, persistent. States initially spotting, and then approximately similar to light period. Had mild lower abd cramping earlier, similar to period cramp - no current abd, pelvic or back pain. No faintness or dizziness. States is [redacted] week pregnant, had prior preg test. lnmp 05/28/18. g2po - one prior first trimester miscarriage. Pt has seen her ob previously with pos preg test. No prior u/s. No dysuria. No fever or chills. Pt indicates blood type is O positive.   The history is provided by the patient.  Vaginal Bleeding  Primary symptoms include vaginal bleeding. Pertinent negatives include no vomiting.    Past Medical History:  Diagnosis Date  . Allergic rhinitis 10/23/2013  . Allergy     Patient Active Problem List   Diagnosis Date Noted  . Sprain of right wrist 01/18/2017  . Wrist pain, acute, right 01/18/2017  . Weight gain 01/18/2017  . Annual physical exam 10/04/2015  . Depression 12/05/2013  . Costochondritis 12/05/2013  . Deliberate self-cutting 12/05/2013  . Obesity, unspecified 12/05/2013  . Allergic rhinitis 10/23/2013    Past Surgical History:  Procedure Laterality Date  . NO PAST SURGERIES       OB History    Gravida  2   Para      Term      Preterm      AB      Living        SAB      TAB      Ectopic      Multiple      Live Births               Home Medications    Prior to Admission medications   Medication Sig Start Date End Date Taking? Authorizing Provider  ferrous sulfate 325 (65 FE) MG tablet Take 325 mg by mouth daily.   Yes [provider]  metroNIDAZOLE (FLAGYL) 500 MG tablet Take 500 mg  by mouth 2 (two) times daily. 07/14/18  Yes [provider]  Prenat MV-Min-Methylfolate-FA (PRENATE PO) Take 1 tablet by mouth daily.   Yes [provider]    Family History Family History  Problem Relation Age of Onset  . Diabetes Mother   . Diabetes Father     Social History Social History   Tobacco Use  . Smoking status: Never Smoker  . Smokeless tobacco: Never Used  Substance Use Topics  . Alcohol use: No  . Drug use: No     Allergies   Patient has no known allergies.   Review of Systems Review of Systems  Constitutional: Negative for fever.  HENT: Negative for sore throat.   Eyes: Negative for redness.  Respiratory: Negative for shortness of breath.   Cardiovascular: Negative for chest pain.  Gastrointestinal: Negative for vomiting.  Genitourinary: Positive for vaginal bleeding. Negative for flank pain.  Musculoskeletal: Negative for back pain.  Skin: Negative for rash.  Neurological: Negative for headaches.  Hematological: Does not bruise/bleed easily.  Psychiatric/Behavioral: Negative for confusion.     Physical Exam Updated Vital Signs BP (!) 122/58   Pulse 95   Temp 98.3 F (36.8 C) (Oral)   Resp  16   Ht 1.499 m (4\' 11" )   Wt 78.5 kg   LMP 05/28/2018   SpO2 100%   BMI 34.94 kg/m   Physical Exam Vitals signs and nursing note reviewed.  Constitutional:      Appearance: Normal appearance. She is well-developed.  HENT:     Head: Atraumatic.  Eyes:     General: No scleral icterus.    Conjunctiva/sclera: Conjunctivae normal.  Neck:     Musculoskeletal: Neck supple.     Trachea: No tracheal deviation.  Cardiovascular:     Rate and Rhythm: Normal rate and regular rhythm.  Pulmonary:     Effort: Pulmonary effort is normal. No respiratory distress.     Breath sounds: Normal breath sounds.  Abdominal:     General: Abdomen is flat. Bowel sounds are normal. There is no distension.     Palpations: Abdomen is soft.      Tenderness: There is no abdominal tenderness.  Genitourinary:    Comments: No cva tenderness. On pelvic exam, small amount dark blood, few small clots, on pelvic exam. No brisk/active bleeding. Cervix closed. No cmt. No adx tenderness/mass.  Musculoskeletal:     Right lower leg: No edema.     Left lower leg: No edema.  Skin:    General: Skin is warm and dry.     Findings: No rash.  Neurological:     Mental Status: She is alert.     Comments: Alert, speech clear/normal.   Psychiatric:        Mood and Affect: Mood normal.      ED Treatments / Results  Labs (all labs ordered are listed, but only abnormal results are displayed) Results for orders placed or performed during the hospital encounter of 07/16/18  hCG, quantitative, pregnancy  Result Value Ref Range   hCG, Beta Chain, Quant, S 4,022 (H) <5 mIU/mL  CBC  Result Value Ref Range   WBC 11.3 (H) 4.0 - 10.5 K/uL   RBC 4.13 3.87 - 5.11 MIL/uL   Hemoglobin 12.8 12.0 - 15.0 g/dL   HCT 40.9 81.1 - 91.4 %   MCV 97.1 80.0 - 100.0 fL   MCH 31.0 26.0 - 34.0 pg   MCHC 31.9 30.0 - 36.0 g/dL   RDW 78.2 95.6 - 21.3 %   Platelets 262 150 - 400 K/uL   nRBC 0.0 0.0 - 0.2 %   US Ob Comp < 14 Wks  Result Date: 07/16/2018 CLINICAL DATA:  Recent spontaneous abortion.  Bleeding. EXAM: OBSTETRIC <14 WK ULTRASOUND TECHNIQUE: Transabdominal ultrasound was performed for evaluation of the gestation as well as the maternal uterus and adnexal regions. COMPARISON:  None. FINDINGS: Intrauterine gestational sac: None Yolk sac:  Not visualized Embryo:  Not visualized Cardiac Activity: Not visualized Heart Rate:  bpm MSD:    mm    w     d CRL:     mm    w  d                  [place] EDC: Subchorionic hemorrhage:  None visualized. Maternal uterus/adnexae: No adnexal mass. Small amount of free fluid. Neither ovary could be visualized. Uterus is retroflexed. There are 2 endometrial canals noted in the fundus raising the possibility of septate or arcuate uterus.  IMPRESSION: No intrauterine pregnancy visualized. Differential considerations would include early intrauterine pregnancy too early to visualize, spontaneous abortion, or occult ectopic pregnancy. Recommend close clinical followup and serial quantitative beta HCGs and ultrasounds. Electronically Signed  By: Charlett NoseKevin  Dover M.D.   On: 07/16/2018 21:33   [place] Ob Transvaginal  Result Date: 07/16/2018 CLINICAL DATA:  Recent spontaneous abortion.  Bleeding. EXAM: OBSTETRIC <14 WK ULTRASOUND TECHNIQUE: Transabdominal ultrasound was performed for evaluation of the gestation as well as the maternal uterus and adnexal regions. COMPARISON:  None. FINDINGS: Intrauterine gestational sac: None Yolk sac:  Not visualized Embryo:  Not visualized Cardiac Activity: Not visualized Heart Rate:  bpm MSD:    mm    w     d CRL:     mm    w  d                  US EDC: Subchorionic hemorrhage:  None visualized. Maternal uterus/adnexae: No adnexal mass. Small amount of free fluid. Neither ovary could be visualized. Uterus is retroflexed. There are 2 endometrial canals noted in the fundus raising the possibility of septate or arcuate uterus. IMPRESSION: No intrauterine pregnancy visualized. Differential considerations would include early intrauterine pregnancy too early to visualize, spontaneous abortion, or occult ectopic pregnancy. Recommend close clinical followup and serial quantitative beta HCGs and ultrasounds. Electronically Signed   By: Charlett NoseKevin  Dover M.D.   On: 07/16/2018 21:33    EKG None  Radiology [place] Ob Comp < 14 Wks  Result Date: 07/16/2018 CLINICAL DATA:  Recent spontaneous abortion.  Bleeding. EXAM: OBSTETRIC <14 WK ULTRASOUND TECHNIQUE: Transabdominal ultrasound was performed for evaluation of the gestation as well as the maternal uterus and adnexal regions. COMPARISON:  None. FINDINGS: Intrauterine gestational sac: None Yolk sac:  Not visualized Embryo:  Not visualized Cardiac Activity: Not visualized Heart Rate:  bpm  MSD:    mm    w     d CRL:     mm    w  d                  US EDC: Subchorionic hemorrhage:  None visualized. Maternal uterus/adnexae: No adnexal mass. Small amount of free fluid. Neither ovary could be visualized. Uterus is retroflexed. There are 2 endometrial canals noted in the fundus raising the possibility of septate or arcuate uterus. IMPRESSION: No intrauterine pregnancy visualized. Differential considerations would include early intrauterine pregnancy too early to visualize, spontaneous abortion, or occult ectopic pregnancy. Recommend close clinical followup and serial quantitative beta HCGs and ultrasounds. Electronically Signed   By: Charlett NoseKevin  Dover M.D.   On: 07/16/2018 21:33   [place] Ob Transvaginal  Result Date: 07/16/2018 CLINICAL DATA:  Recent spontaneous abortion.  Bleeding. EXAM: OBSTETRIC <14 WK ULTRASOUND TECHNIQUE: Transabdominal ultrasound was performed for evaluation of the gestation as well as the maternal uterus and adnexal regions. COMPARISON:  None. FINDINGS: Intrauterine gestational sac: None Yolk sac:  Not visualized Embryo:  Not visualized Cardiac Activity: Not visualized Heart Rate:  bpm MSD:    mm    w     d CRL:     mm    w  d                  US EDC: Subchorionic hemorrhage:  None visualized. Maternal uterus/adnexae: No adnexal mass. Small amount of free fluid. Neither ovary could be visualized. Uterus is retroflexed. There are 2 endometrial canals noted in the fundus raising the possibility of septate or arcuate uterus. IMPRESSION: No intrauterine pregnancy visualized. Differential considerations would include early intrauterine pregnancy too early to visualize, spontaneous abortion, or occult ectopic pregnancy. Recommend close clinical followup and serial quantitative beta HCGs and ultrasounds. Electronically  Signed   By: Charlett Nose M.D.   On: 07/16/2018 21:33    Procedures Procedures (including critical care time)  Medications Ordered in ED Medications - No data to  display   Initial Impression / Assessment and Plan / ED Course  I have reviewed the triage vital signs and the nursing notes.  Pertinent labs & imaging results that were available during my care of the patient were reviewed by me and considered in my medical decision making (see chart for details).  Labs sent.   Reviewed nursing notes and prior charts for additional history.   Labs reviewed - quant 4022. Pt notes blood type O pos.   Recheck abd soft nt.   U/s reviewed - no iup. ?miscarriage, vs early iup, vs r/o ectopic.  Discussed w pt - f/u gyn 2 days, repeat quant, re-eval.   Pt currently appears stable for d/c.     Final Clinical Impressions(s) / ED Diagnoses   Final diagnoses:  None    ED Discharge Orders    None       Cathren Laine, MD 07/16/18 2215

## 2018-07-16 NOTE — ED Notes (Signed)
Patient Alert and oriented to baseline. Stable and ambulatory to baseline. Patient verbalized understanding of the discharge instructions.  Patient belongings were taken by the patient.   

## 2018-07-16 NOTE — Discharge Instructions (Signed)
It was our pleasure to provide your ER care today - we hope that you feel better.  Today, your quantitative hcg (preg test) is 4022.  Your ultrasound did not show an intrauterine pregnancy.   Rest. Drink plenty of fluids.  Follow up with your ob/gyn doctor this Monday for recheck, re-evaluation, and possible repeat hcg level.   [facility] if worse, very heavy bleeding, severe pain, other concern.

## 2018-07-16 NOTE — ED Notes (Signed)
Pt returned from Ultrasound.

## 2018-07-16 NOTE — ED Triage Notes (Signed)
Pt c/o abdominal pain and vaginal bleeding that started today. LMP 05/28/18. Pt states she is [redacted] weeks pregnant.

## 2018-07-18 ENCOUNTER — Inpatient Hospital Stay (HOSPITAL_COMMUNITY)
Admission: AD | Admit: 2018-07-18 | Discharge: 2018-07-18 | Disposition: A | Discharge status: Home / Self Care | Payer: Medicaid Other | Attending: Obstetrics and Gynecology | Admitting: Obstetrics and Gynecology

## 2018-07-18 ENCOUNTER — Encounter (HOSPITAL_COMMUNITY): Payer: Self-pay | Admitting: Obstetrics and Gynecology

## 2018-07-18 ENCOUNTER — Other Ambulatory Visit: Payer: Self-pay

## 2018-07-18 DIAGNOSIS — Z3A01 Less than 8 weeks gestation of pregnancy: Secondary | ICD-10-CM | POA: Insufficient documentation

## 2018-07-18 DIAGNOSIS — O039 Complete or unspecified spontaneous abortion without complication: Secondary | ICD-10-CM | POA: Diagnosis present

## 2018-07-18 LAB — HCG, QUANTITATIVE, PREGNANCY: hCG, Beta Chain, Quant, S: 605 m[IU]/mL — ABNORMAL HIGH (ref <5)

## 2018-07-18 MED ORDER — ONDANSETRON 4 MG PO TBDP: 4.0000 mg | ORAL_TABLET | Freq: Three times a day (TID) | ORAL | 0 refills | Status: DC | PRN

## 2018-07-18 NOTE — MAU Note (Signed)
Urine in lab 

## 2018-07-18 NOTE — MAU Note (Signed)
Started Sat afternoon.  Started as spotting, gradually got heavier.  [facility]  Was dx with a threatened miscarriage.  Called OB/GYN today as instructed, was told to come back to the ER, could be an ectopic miscarriage.  Still bleeding, not as heavy

## 2018-07-18 NOTE — MAU Provider Note (Signed)
History   Chief Complaint:  Follow-up   Jillian Richardson is  22 y.o. G2P0 Patient's last menstrual period was 05/28/2018. Patient is here for follow up of quantitative HCG and ongoing surveillance of pregnancy status. She is [redacted]w[redacted]d weeks gestation by LMP.    Since her last visit, the patient is without new complaint.   The patient reports bleeding as  similar to period.  She was seen at [facility] on 12/14, dx'd with threatened miscarriage. She was told to F/U with her OB in 48 hrs. She called her OB and she was instructed to come here because their office is unable to perform HCG.  General ROS:  positive for VB  Her previous Quantitative HCG values are: 4,022 on (07/16/18)     Physical Exam   Blood pressure (!) 123/58, pulse 79, temperature 98 F (36.7 C), temperature source Oral, resp. rate 16, height 4\' 11"  (1.499 m), weight 78.9 kg, last menstrual period 05/28/2018, SpO2 100 %.  Focused Gynecological Exam: normal external genitalia, vulva, vagina, cervix, uterus and adnexa, exam declined by the patient  Labs: Results for orders placed or performed during the hospital encounter of 07/18/18 (from the past 24 hour(s))  hCG, quantitative, pregnancy   Collection Time: 07/18/18  2:08 PM  Result Value Ref Range   hCG, Beta Chain, Quant, S 605 (H) <5 mIU/mL    Ultrasound Studies:   US Ob Comp < 14 Wks  Result Date: 07/16/2018 CLINICAL DATA:  Recent spontaneous abortion.  Bleeding. EXAM: OBSTETRIC <14 WK ULTRASOUND TECHNIQUE: Transabdominal ultrasound was performed for evaluation of the gestation as well as the maternal uterus and adnexal regions. COMPARISON:  None. FINDINGS: Intrauterine gestational sac: None Yolk sac:  Not visualized Embryo:  Not visualized Cardiac Activity: Not visualized Heart Rate:  bpm MSD:    mm    w     d CRL:     mm    w  d                  [place] EDC: Subchorionic hemorrhage:  None visualized. Maternal uterus/adnexae: No adnexal mass. Small amount of free fluid. Neither  ovary could be visualized. Uterus is retroflexed. There are 2 endometrial canals noted in the fundus raising the possibility of septate or arcuate uterus. IMPRESSION: No intrauterine pregnancy visualized. Differential considerations would include early intrauterine pregnancy too early to visualize, spontaneous abortion, or occult ectopic pregnancy. Recommend close clinical followup and serial quantitative beta HCGs and ultrasounds. Electronically Signed   By: Charlett Nose M.D.   On: 07/16/2018 21:33   US Ob Transvaginal  Result Date: 07/16/2018 CLINICAL DATA:  Recent spontaneous abortion.  Bleeding. EXAM: OBSTETRIC <14 WK ULTRASOUND TECHNIQUE: Transabdominal ultrasound was performed for evaluation of the gestation as well as the maternal uterus and adnexal regions. COMPARISON:  None. FINDINGS: Intrauterine gestational sac: None Yolk sac:  Not visualized Embryo:  Not visualized Cardiac Activity: Not visualized Heart Rate:  bpm MSD:    mm    w     d CRL:     mm    w  d                  [place] EDC: Subchorionic hemorrhage:  None visualized. Maternal uterus/adnexae: No adnexal mass. Small amount of free fluid. Neither ovary could be visualized. Uterus is retroflexed. There are 2 endometrial canals noted in the fundus raising the possibility of septate or arcuate uterus. IMPRESSION: No intrauterine pregnancy visualized. Differential considerations would include early  intrauterine pregnancy too early to visualize, spontaneous abortion, or occult ectopic pregnancy. Recommend close clinical followup and serial quantitative beta HCGs and ultrasounds. Electronically Signed   By: Charlett NoseKevin  Dover M.D.   On: 07/16/2018 21:33    Assessment: 2470w2d weeks gestation  By LMP Miscarriage - Plan: Discharge patient   Plan: The patient is instructed to follow up in in 2 days in [facility] for Rpt HCG.  [name], MSN, CNM 07/16/2018, 3:19 PM

## 2018-07-20 ENCOUNTER — Ambulatory Visit (INDEPENDENT_AMBULATORY_CARE_PROVIDER_SITE_OTHER): Payer: Medicaid Other | Admitting: *Deleted

## 2018-07-20 DIAGNOSIS — O3680X0 Pregnancy with inconclusive fetal viability, not applicable or unspecified: Secondary | ICD-10-CM

## 2018-07-20 LAB — HCG, QUANTITATIVE, PREGNANCY: hCG, Beta Chain, Quant, S: 207 m[IU]/mL — ABNORMAL HIGH (ref <5)

## 2018-07-20 NOTE — Progress Notes (Signed)
Here for stat bhcg.History of miscarriage in September but did not follow up appropriately. Then seen in MAU 12/14 and 07/18/18.  C/o severe cramping pelvic pain this am =9 and took tylenol and made pain less more like 6. C/o  light bleeding- states changed pad twice yesterday. Discussed history and complaints with Dr. Shawnie PonsPratt and will proceed with stat bhcg.  Explained we will draw stat bhcg and have her wait in lobby for results which we will then discuss with provider and then her. She voices understanding.  Legrand ComoLInda,RN

## 2018-07-20 NOTE — Progress Notes (Signed)
Patient seen and assessed by nursing staff.  Agree with documentation and plan.  

## 2018-07-20 NOTE — Progress Notes (Signed)
Reviewed results with Dr. Shawnie PonsPratt and informed Jillian CanesChristine Richardson dropped significantly and is miscarriage. We recommend repeat non stat Richardson in 1 week and see provider in 2 weeks. Advised no intercourse or use condoms until Richardson levels return to normal. She voices understanding.  Jillian ComoLInda,RN

## 2018-07-25 ENCOUNTER — Other Ambulatory Visit: Payer: Self-pay | Admitting: *Deleted

## 2018-07-25 DIAGNOSIS — O039 Complete or unspecified spontaneous abortion without complication: Secondary | ICD-10-CM

## 2018-07-28 ENCOUNTER — Other Ambulatory Visit: Payer: Medicaid Other

## 2018-08-05 ENCOUNTER — Ambulatory Visit: Payer: Medicaid Other | Admitting: Obstetrics & Gynecology

## 2018-08-05 ENCOUNTER — Encounter: Payer: Self-pay | Admitting: Family Medicine

## 2018-09-26 ENCOUNTER — Ambulatory Visit: Payer: Medicaid Other | Admitting: Family Medicine

## 2018-10-31 MED ORDER — CEPHALEXIN 500 MG PO CAPS: 500.0000 mg | ORAL_CAPSULE | Freq: Four times a day (QID) | ORAL | 0 refills | Status: AC

## 2019-03-16 ENCOUNTER — Encounter: Payer: Self-pay | Admitting: Family Medicine

## 2019-03-16 ENCOUNTER — Other Ambulatory Visit: Payer: Self-pay

## 2019-03-16 ENCOUNTER — Ambulatory Visit (INDEPENDENT_AMBULATORY_CARE_PROVIDER_SITE_OTHER): Payer: Medicaid Other | Admitting: Family Medicine

## 2019-03-16 VITALS — BP 106/54 | HR 76 | Temp 98.8°F | Resp 14 | Ht 59.0 in | Wt 176.0 lb

## 2019-03-16 DIAGNOSIS — Z3202 Encounter for pregnancy test, result negative: Secondary | ICD-10-CM

## 2019-03-16 DIAGNOSIS — N96 Recurrent pregnancy loss: Secondary | ICD-10-CM

## 2019-03-16 DIAGNOSIS — R110 Nausea: Secondary | ICD-10-CM | POA: Diagnosis not present

## 2019-03-16 LAB — POCT URINE PREGNANCY: Preg Test, Ur: NEGATIVE

## 2019-03-16 NOTE — Patient Instructions (Signed)
I am referring you to a gynecologist for further evaluation.    Nausea, Adult Nausea is feeling sick to your stomach or feeling that you are about to throw up (vomit). Feeling sick to your stomach is usually not serious, but it may be an early sign of a more serious medical problem. As you feel sicker to your stomach, you may throw up. If you throw up, or if you are not able to drink enough fluids, there is a risk that you may lose too much water in your body (get dehydrated). If you lose too much water in your body, you may:  Feel tired.  Feel thirsty.  Have a dry mouth.  Have cracked lips.  Go pee (urinate) less often. Older adults and people who have other diseases or a weak body defense system (immune system) have a higher risk of losing too much water in the body. The main goals of treating this condition are:  To relieve your nausea.  To ensure your nausea occurs less often.  To prevent throwing up and losing too much fluid. Follow these instructions at home: Watch your symptoms for any changes. Tell your doctor about them. Follow these instructions as told by your doctor. Eating and drinking      Take an ORS (oral rehydration solution). This is a drink that is sold at pharmacies and stores.  Drink clear fluids in small amounts as you are able. These include: ? Water. ? Ice chips. ? Fruit juice that has water added (diluted fruit juice). ? Low-calorie sports drinks.  Eat bland, easy-to-digest foods in small amounts as you are able, such as: ? Bananas. ? Applesauce. ? Rice. ? Low-fat (lean) meats. ? Toast. ? Crackers.  Avoid drinking fluids that have a lot of sugar or caffeine in them. This includes energy drinks, sports drinks, and soda.  Avoid alcohol.  Avoid spicy or fatty foods. General instructions  Take over-the-counter and prescription medicines only as told by your doctor.  Rest at home while you get better.  Drink enough fluid to keep your pee  (urine) pale yellow.  Take slow and deep breaths when you feel sick to your stomach.  Avoid food or things that have strong smells.  Wash your hands often with soap and water. If you cannot use soap and water, use hand sanitizer.  Make sure that all people in your home wash their hands well and often.  Keep all follow-up visits as told by your doctor. This is important. Contact a doctor if:  You feel sicker to your stomach.  You feel sick to your stomach for more than 2 days.  You throw up.  You are not able to drink fluids without throwing up.  You have new symptoms.  You have a fever.  You have a headache.  You have muscle cramps.  You have a rash.  You have pain while peeing.  You feel light-headed or dizzy. Get help right away if:  You have pain in your chest, neck, arm, or jaw.  You feel very weak or you pass out (faint).  You have throw up that is bright red or looks like coffee grounds.  You have bloody or black poop (stools) or poop that looks like tar.  You have a very bad headache, a stiff neck, or both.  You have very bad pain, cramping, or bloating in your belly (abdomen).  You have trouble breathing or you are breathing very quickly.  Your heart is beating very  quickly.  Your skin feels cold and clammy.  You feel confused.  You have signs of losing too much water in your body, such as: ? Dark pee, very little pee, or no pee. ? Cracked lips. ? Dry mouth. ? Sunken eyes. ? Sleepiness. ? Weakness. These symptoms may be an emergency. Do not wait to see if the symptoms will go away. Get medical help right away. Call your local emergency services (911 in the [place] Do not drive yourself to the hospital. Summary  Nausea is feeling sick to your stomach or feeling that you are about to throw up (vomit).  If you throw up, or if you are not able to drink enough fluids, there is a risk that you may lose too much water in your body (get dehydrated).   Eat and drink what your doctor tells you. Take over-the-counter and prescription medicines only as told by your doctor.  Contact a doctor right away if your symptoms get worse or you have new symptoms.  Keep all follow-up visits as told by your doctor. This is important. This information is not intended to replace advice given to you by your health care provider. Make sure you discuss any questions you have with your health care provider. Document Released: 07/09/2011 Document Revised: 12/28/2017 Document Reviewed: 12/28/2017 Elsevier Patient Education  2020 [employer]

## 2019-03-16 NOTE — Progress Notes (Signed)
  Patient Jillian Richardson [facility]   Progress Note: General Provider: Lanae Boast, FNP  SUBJECTIVE:   Jillian Richardson is a 23 y.o. female who  has a past medical history of Allergic rhinitis (10/23/2013) and Allergy.. Patient presents today for Follow-up (ER follow up for right side pain ) and Nausea  Patient seen in the [facility] ED for right sided abdominal pain. Patient report having a negative ultrasound and pelvic exam. She states that she has had multiple miscarriages and would like to have further evaluation by a specialist. She states that she is having intermittent mild nausea. She denies pain today. Pregnancy test is negative.   Review of Systems  Constitutional: Negative.   HENT: Negative.   Eyes: Negative.   Respiratory: Negative.   Cardiovascular: Negative.   Gastrointestinal: Positive for nausea.  Genitourinary: Negative.   Musculoskeletal: Negative.   Skin: Negative.   Neurological: Negative.   Psychiatric/Behavioral: Negative.      OBJECTIVE: BP (!) 106/54 (BP Location: Left Arm, Patient Position: Sitting, Cuff Size: Large)   Pulse 76   Temp 98.8 F (37.1 C) (Oral)   Resp 14   Ht 4\' 11"  (1.499 m)   Wt 176 lb (79.8 kg)   LMP 02/17/2019   SpO2 100%   Breastfeeding Unknown   BMI 35.55 kg/m   Wt Readings from Last 3 Encounters:  03/16/19 176 lb (79.8 kg)  07/18/18 174 lb (78.9 kg)  07/16/18 173 lb (78.5 kg)     Physical Exam Vitals signs and nursing note reviewed.  Constitutional:      General: She is not in acute distress.    Appearance: Normal appearance.  HENT:     Head: Normocephalic and atraumatic.  Eyes:     Extraocular Movements: Extraocular movements intact.     Conjunctiva/sclera: Conjunctivae normal.     Pupils: Pupils are equal, round, and reactive to light.  Cardiovascular:     Rate and Rhythm: Normal rate and regular rhythm.     Heart sounds: No murmur.  Pulmonary:     Effort: Pulmonary effort is  normal.     Breath sounds: Normal breath sounds.  Musculoskeletal: Normal range of motion.  Skin:    General: Skin is warm and dry.  Neurological:     Mental Status: She is alert and oriented to person, place, and time.  Psychiatric:        Mood and Affect: Mood normal.        Behavior: Behavior normal.        Thought Content: Thought content normal.        Judgment: Judgment normal.     ASSESSMENT/PLAN:  1. History of multiple miscarriages Pregnancy test negative today. Patient would like referal for further evaluation.  - Ambulatory referral to Obstetrics / Gynecology - POCT urine pregnancy  2. Nausea Negative pregnancy test today. Discussed eating small frequent meals, ginger and broth.     Return if symptoms worsen or fail to improve.    The patient was given clear instructions to go to ER or return to medical center if symptoms do not improve, worsen or new problems develop. The patient verbalized understanding and agreed with plan of care.   Ms. Doug Sou. Nathaneil Canary, FNP-BC [facility] [address] [place], [place] [address] [phone]

## 2019-04-12 ENCOUNTER — Encounter (HOSPITAL_COMMUNITY): Payer: Self-pay | Admitting: *Deleted

## 2019-04-12 ENCOUNTER — Encounter (HOSPITAL_COMMUNITY): Payer: Self-pay

## 2019-07-07 ENCOUNTER — Encounter: Payer: Self-pay | Admitting: Family Medicine

## 2019-10-02 ENCOUNTER — Telehealth: Payer: Self-pay | Admitting: Family Medicine

## 2019-10-02 NOTE — Telephone Encounter (Signed)
Called pt. No answer, but left a message to remind her of appointment.

## 2019-10-03 ENCOUNTER — Ambulatory Visit: Payer: Medicaid Other | Admitting: Family Medicine

## 2020-02-09 ENCOUNTER — Encounter: Payer: Self-pay | Admitting: Nurse Practitioner

## 2020-02-09 ENCOUNTER — Ambulatory Visit (INDEPENDENT_AMBULATORY_CARE_PROVIDER_SITE_OTHER): Payer: Medicaid Other | Admitting: Nurse Practitioner

## 2020-02-09 ENCOUNTER — Other Ambulatory Visit: Payer: Self-pay

## 2020-02-09 VITALS — BP 130/54 | HR 77 | Temp 97.9°F | Ht 59.0 in | Wt 180.1 lb

## 2020-02-09 DIAGNOSIS — Z0000 Encounter for general adult medical examination without abnormal findings: Secondary | ICD-10-CM

## 2020-02-09 NOTE — Progress Notes (Signed)
[facility] [address] [place], [place]  [address] Phone:  [phone]   Fax:  [phone]   Established Patient Office Visit  Subjective:  Patient ID: Jillian Richardson, female    DOB: Oct 16, 1995  Age: 24 y.o. MRN: [id number]  CC:  Chief Complaint  Patient presents with  . Follow-up    need physical form for her to be able to start school    HPI [facility] presents for follow up. She  has a past medical history of Allergic rhinitis. She is going to start class for CNA. She denies any problems. Denies headache, dizziness, visual changes, shortness of breath, dyspnea on exertion, chest pain, nausea, vomiting or any edema.    Past Medical History:  Diagnosis Date  . Allergic rhinitis 10/23/2013  . Allergy     Past Surgical History:  Procedure Laterality Date  . NO PAST SURGERIES      Family History  Problem Relation Age of Onset  . Diabetes Mother   . Diabetes Father     Social History   Socioeconomic History  . Marital status: Single    Spouse name: Not on file  . Number of children: Not on file  . Years of education: Not on file  . Highest education level: Not on file  Occupational History  . Not on file  Tobacco Use  . Smoking status: Never Smoker  . Smokeless tobacco: Never Used  Vaping Use  . Vaping Use: Never used  Substance and Sexual Activity  . Alcohol use: No  . Drug use: No  . Sexual activity: Yes    Birth control/protection: None  Other Topics Concern  . Not on file  Social History Narrative  . Not on file   Social Determinants of Health   Financial Resource Strain:   . Difficulty of Paying Living Expenses:   Food Insecurity:   . Worried About Programme researcher, broadcasting/film/video in the Last Year:   . Barista in the Last Year:   Transportation Needs:   . Freight forwarder (Medical):   Marland Kitchen Lack of Transportation (Non-Medical):   Physical Activity:   . Days of Exercise per Week:   . Minutes of Exercise per  Session:   Stress:   . Feeling of Stress :   Social Connections:   . Frequency of Communication with Friends and Family:   . Frequency of Social Gatherings with Friends and Family:   . Attends Religious Services:   . Active Member of Clubs or Organizations:   . Attends Banker Meetings:   Marland Kitchen Marital Status:   Intimate Partner Violence:   . Fear of Current or Ex-Partner:   . Emotionally Abused:   Marland Kitchen Physically Abused:   . Sexually Abused:     Outpatient Medications Prior to Visit  Medication Sig Dispense Refill  . ferrous sulfate 325 (65 FE) MG tablet Take 325 mg by mouth daily. (Patient not taking: Reported on 02/09/2020)    . ondansetron (ZOFRAN ODT) 4 MG disintegrating tablet Take 1 tablet (4 mg total) by mouth every 8 (eight) hours as needed for nausea or vomiting. (Patient not taking: Reported on 03/16/2019) 20 tablet 0  . Prenat MV-Min-Methylfolate-FA (PRENATE PO) Take 1 tablet by mouth daily. (Patient not taking: Reported on 02/09/2020)     No facility-administered medications prior to visit.    No Known Allergies  ROS Review of Systems  All other systems reviewed and are negative.  Objective:    Physical Exam Vitals reviewed.  Constitutional:      General: She is not in acute distress.    Appearance: She is obese. She is not ill-appearing, toxic-appearing or diaphoretic.  HENT:     Head: Normocephalic.     Nose: Nose normal.     Mouth/Throat:     Mouth: Mucous membranes are dry.     Pharynx: Oropharynx is clear.  Cardiovascular:     Rate and Rhythm: Normal rate and regular rhythm.     Pulses: Normal pulses.     Heart sounds: Normal heart sounds.  Pulmonary:     Effort: Pulmonary effort is normal.     Breath sounds: Normal breath sounds.  Abdominal:     General: Bowel sounds are normal.     Palpations: Abdomen is soft.  Musculoskeletal:        General: Normal range of motion.     Cervical back: Normal range of motion.  Skin:    General: Skin  is warm and dry.     Capillary Refill: Capillary refill takes 2 to 3 seconds.  Neurological:     General: No focal deficit present.     Mental Status: She is alert and oriented to person, place, and time.  Psychiatric:        Mood and Affect: Mood normal.        Behavior: Behavior normal.        Thought Content: Thought content normal.        Judgment: Judgment normal.     BP (!) 130/54   Pulse 77   Temp 97.9 F (36.6 C)   Ht 4\' 11"  (1.499 m)   Wt 180 lb 0.8 oz (81.7 kg)   LMP 02/09/2020   SpO2 100%   Breastfeeding No   BMI 36.37 kg/m  Wt Readings from Last 3 Encounters:  02/09/20 180 lb 0.8 oz (81.7 kg)  03/16/19 176 lb (79.8 kg)  07/18/18 174 lb (78.9 kg)     There are no preventive care reminders to display for this patient.  There are no preventive care reminders to display for this patient.  Lab Results  Component Value Date   TSH 2.97 01/18/2017   Lab Results  Component Value Date   WBC 11.3 (H) 07/16/2018   HGB 12.8 07/16/2018   HCT 40.1 07/16/2018   MCV 97.1 07/16/2018   PLT 262 07/16/2018   Lab Results  Component Value Date   NA 139 05/06/2018   K 4.0 05/06/2018   CO2 21 05/06/2018   GLUCOSE 76 05/06/2018   BUN 14 05/06/2018   CREATININE 0.57 05/06/2018   BILITOT <0.2 05/06/2018   ALKPHOS 80 05/06/2018   AST 13 05/06/2018   ALT 13 05/06/2018   PROT 6.8 05/06/2018   ALBUMIN 4.3 05/06/2018   CALCIUM 9.6 05/06/2018   ANIONGAP 9 06/29/2016   Lab Results  Component Value Date   CHOL 191 (H) 10/11/2015   Lab Results  Component Value Date   HDL 70 10/11/2015   Lab Results  Component Value Date   LDLCALC 93 10/11/2015   Lab Results  Component Value Date   TRIG 141 10/11/2015   Lab Results  Component Value Date   CHOLHDL 2.7 10/11/2015   Lab Results  Component Value Date   HGBA1C 5.2 07/20/2017      Assessment & Plan:   Problem List Items Addressed This Visit    None    Visit Diagnoses  Healthcare maintenance    -   Primary      No orders of the defined types were placed in this encounter.   Follow-up: Return for follow up as needed.    Barbette Merino, NP

## 2020-02-12 ENCOUNTER — Other Ambulatory Visit: Payer: Self-pay | Admitting: Nurse Practitioner

## 2020-02-12 DIAGNOSIS — Z23 Encounter for immunization: Secondary | ICD-10-CM

## 2020-02-12 DIAGNOSIS — Z0184 Encounter for antibody response examination: Secondary | ICD-10-CM

## 2020-02-12 DIAGNOSIS — Z0000 Encounter for general adult medical examination without abnormal findings: Secondary | ICD-10-CM

## 2020-02-12 MED ORDER — VARICELLA VIRUS VACCINE LIVE 1350 PFU/0.5ML IJ SUSR: 0.5000 mL | Freq: Once | INTRAMUSCULAR | 0 refills | Status: AC

## 2020-02-12 MED ORDER — DTAP-HEPATITIS B RECOMB-IPV IM SUSP: 0.5000 mL | Freq: Once | INTRAMUSCULAR | Status: DC

## 2020-05-04 IMAGING — US US OB COMP LESS 14 WK
1 series · 14 of 28 positions shown · non-contrast
Comparison: None.

CLINICAL DATA: Recent spontaneous abortion.  Bleeding.

EXAM:
OBSTETRIC <14 WK ULTRASOUND
TECHNIQUE: Transabdominal ultrasound was performed for evaluation of the
gestation as well as the maternal uterus and adnexal regions.

[Series 1: us ob comp less 14 wk · 0.25mm/px · 42 acquisitions, 14 frames shown]
[im 2/42]
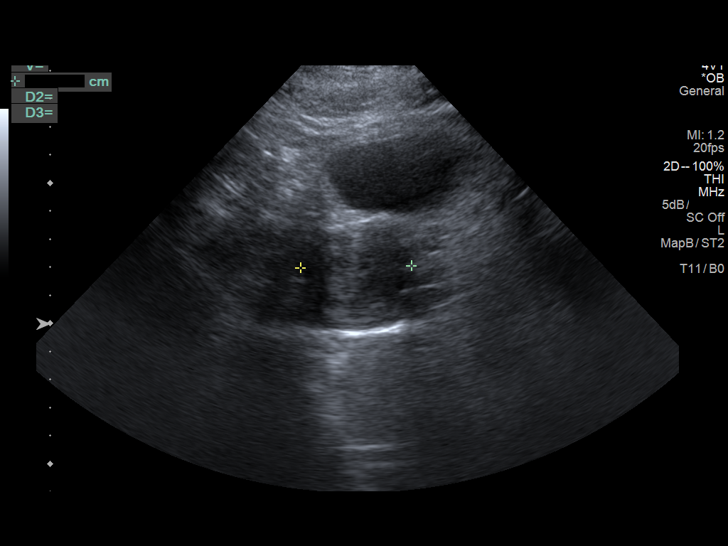
[im 5/42]
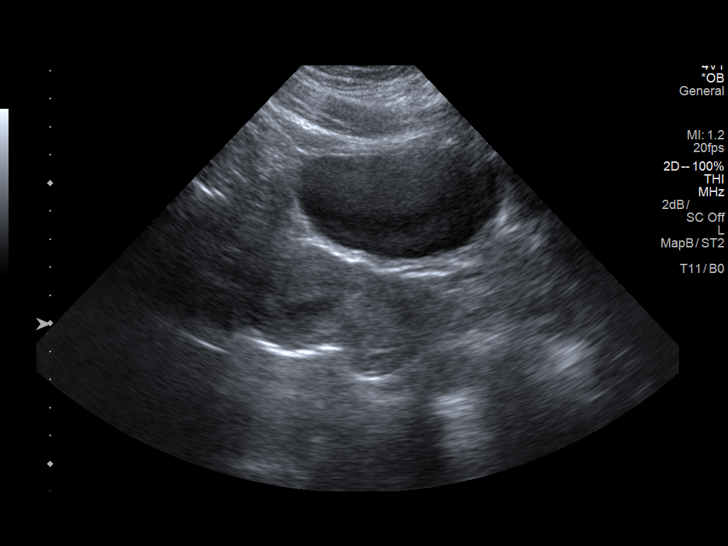
[im 8/42]
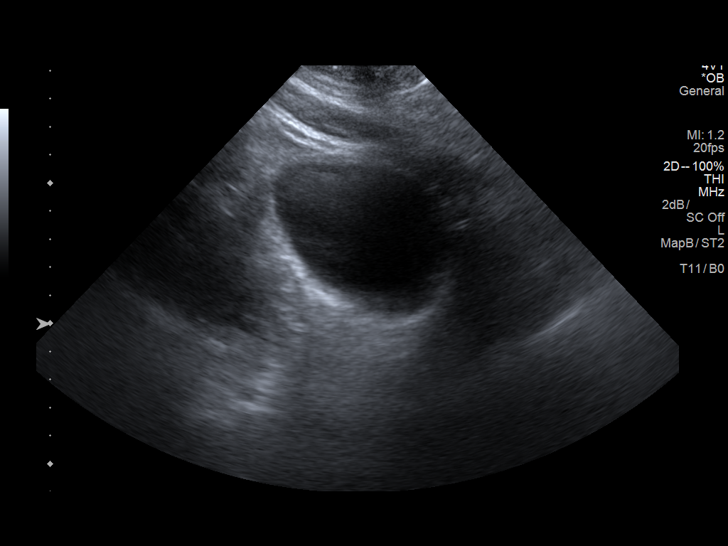
[im 11/42]
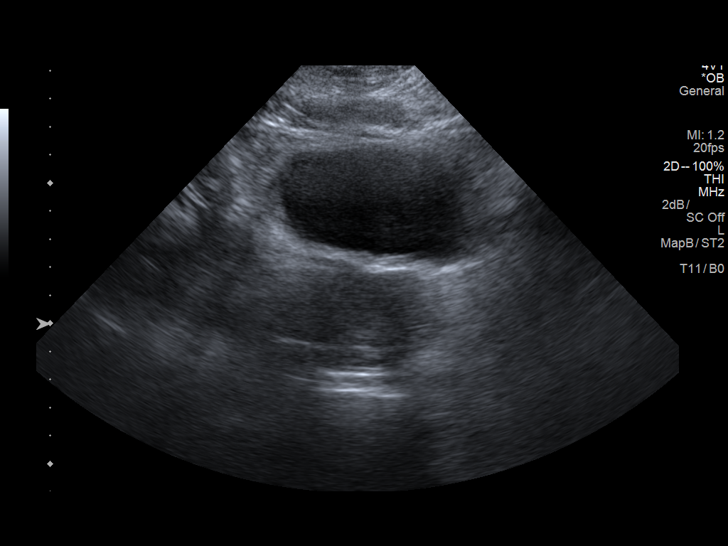
[im 14/42]
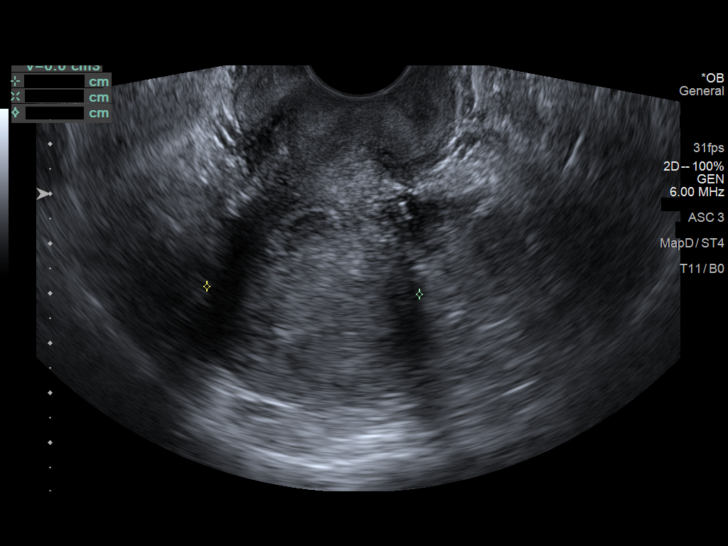
[im 17/42]
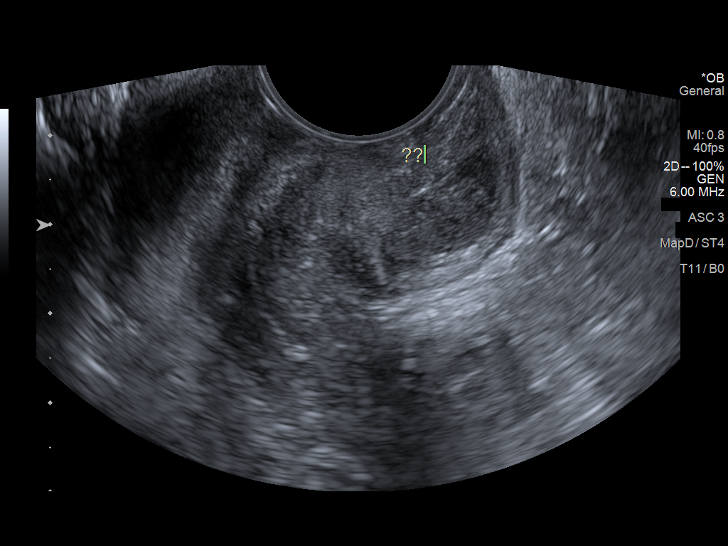
[im 20/42]
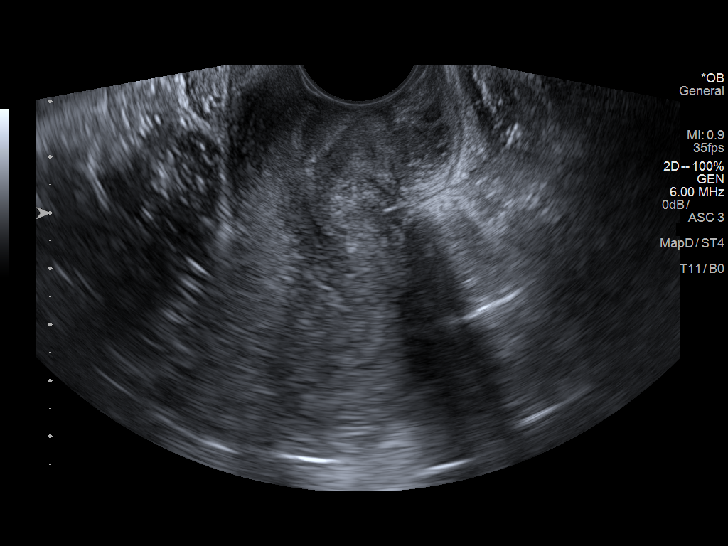
[im 23/42]
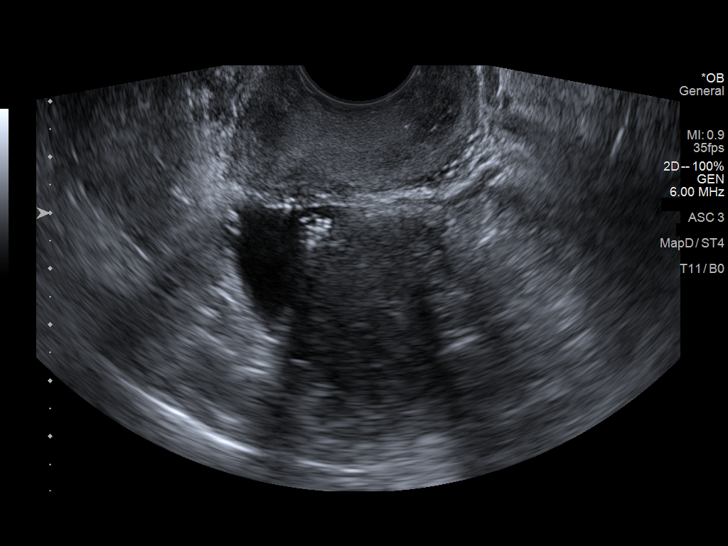
[im 26/42]
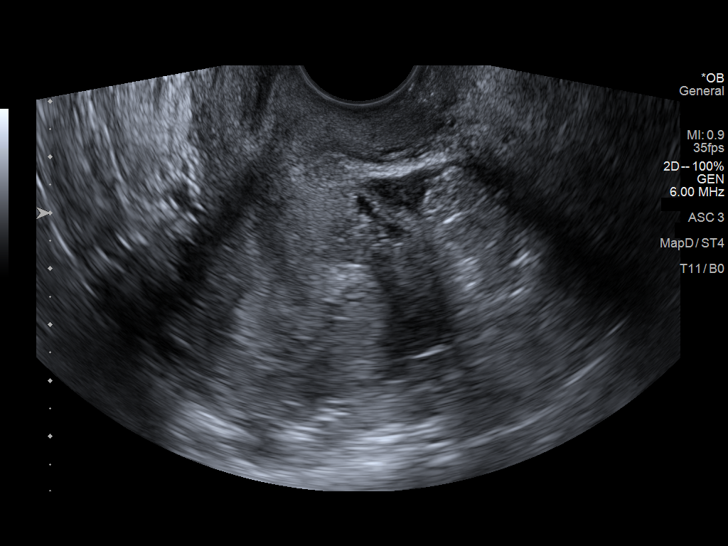
[im 29/42]
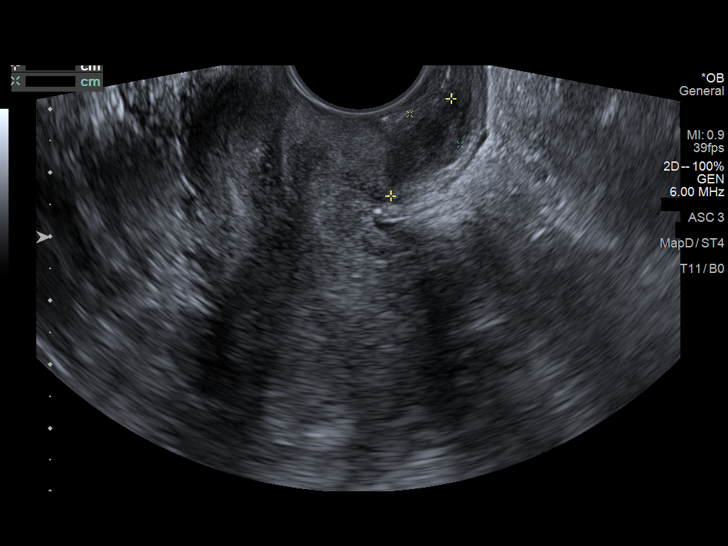
[im 32/42]
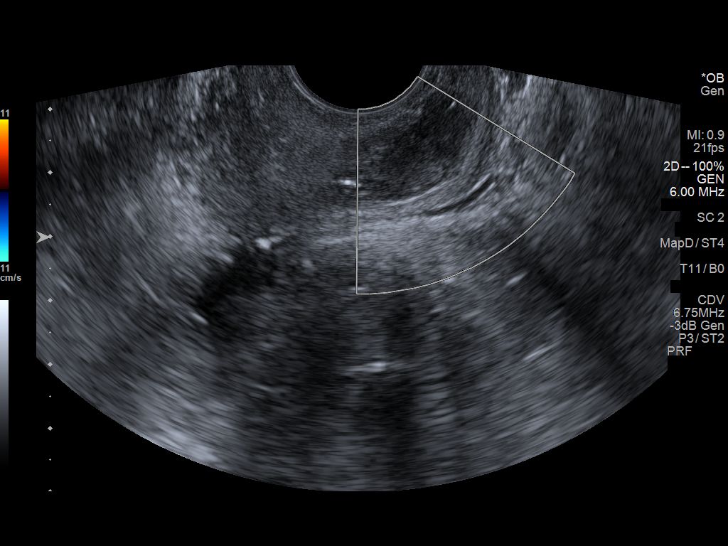
[im 35/42]
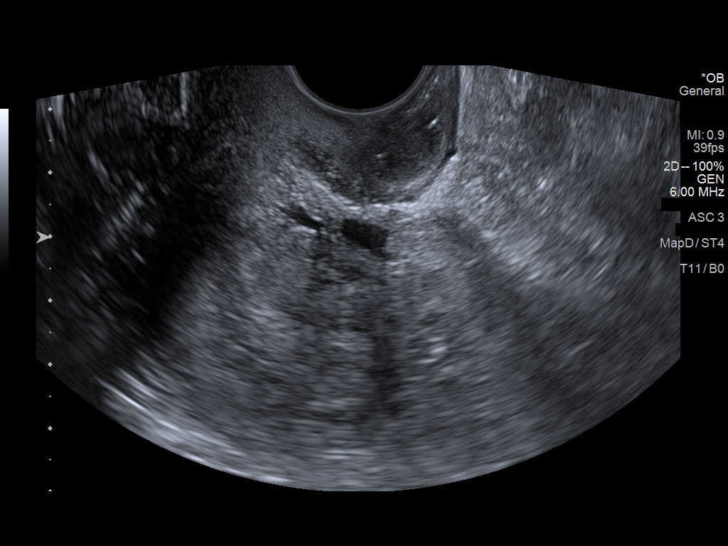
[im 38/42]
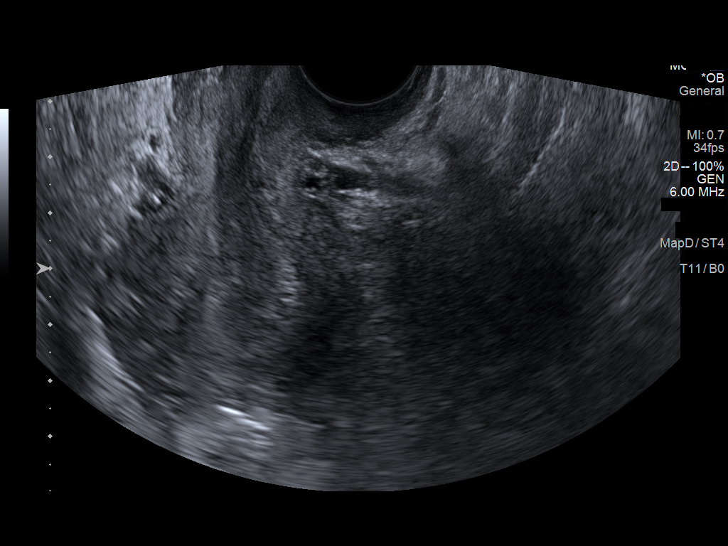
[im 42/42]
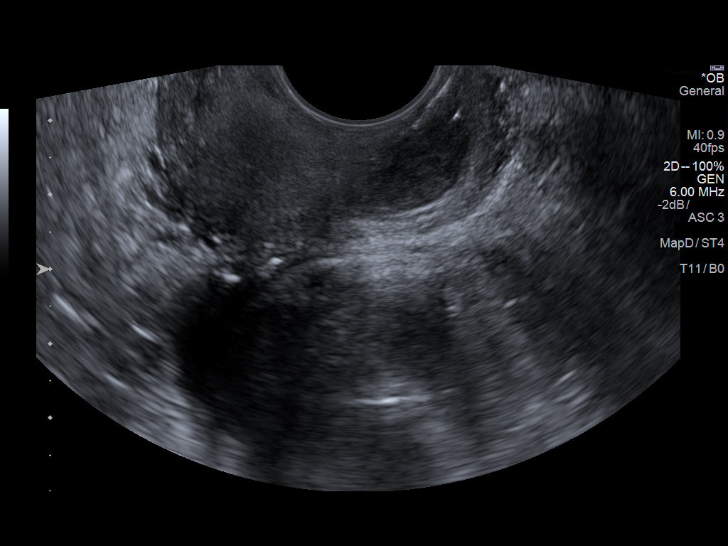

[14 of 28 positions shown; findings below may reference images not displayed]

FINDINGS: Intrauterine gestational sac: None

Yolk sac:  Not visualized

Embryo:  Not visualized

Cardiac Activity: Not visualized

Heart Rate:  bpm

MSD:    mm    w     d

CRL:     mm    w  d                  US EDC:

Subchorionic hemorrhage:  None visualized.

Maternal uterus/adnexae: No adnexal mass. Small amount of free
fluid. Neither ovary could be visualized. Uterus is retroflexed.
There are 2 endometrial canals noted in the fundus raising the
possibility of septate or arcuate uterus.
IMPRESSION: No intrauterine pregnancy visualized. Differential considerations
would include early intrauterine pregnancy too early to visualize,
spontaneous abortion, or occult ectopic pregnancy. Recommend close
clinical followup and serial quantitative beta HCGs and ultrasounds.

## 2020-06-12 IMAGING — US US OB < 14 WEEKS - US OB TV
1 series · 15 of 28 positions shown · non-contrast
Comparison: 04/27/2018

CLINICAL DATA: Vaginal bleeding in first trimester of pregnancy;
quantitative beta HCG = 318

EXAM:
OBSTETRIC <14 WK US AND TRANSVAGINAL OB US
TECHNIQUE: Both transabdominal and transvaginal ultrasound examinations were
performed for complete evaluation of the gestation as well as the
maternal uterus, adnexal regions, and pelvic cul-de-sac.
Transvaginal technique was performed to assess early pregnancy.

[Series 1: us ob < 14 weeks - us ob tv · 37 acquisitions, 15 frames shown]
[im 1/37]
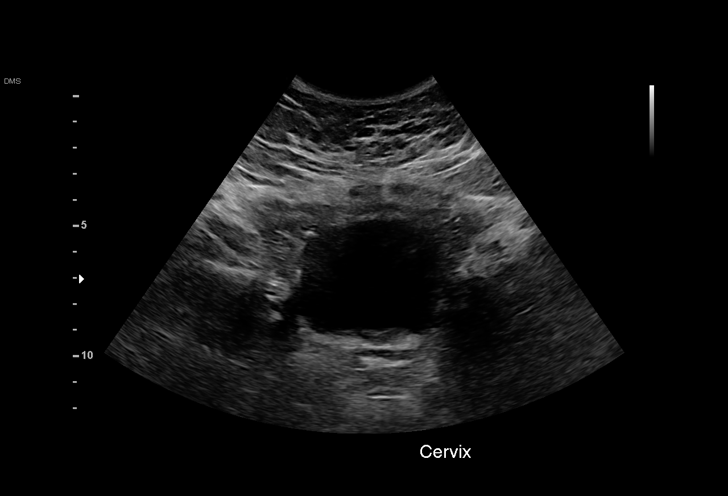
[im 3/37]
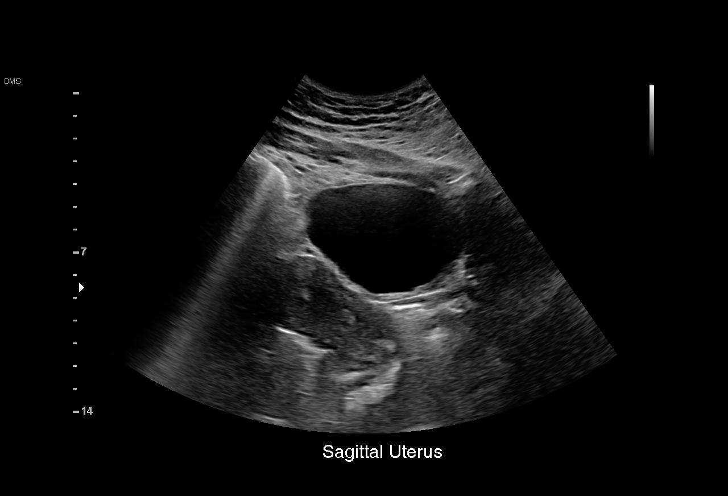
[im 6/37]
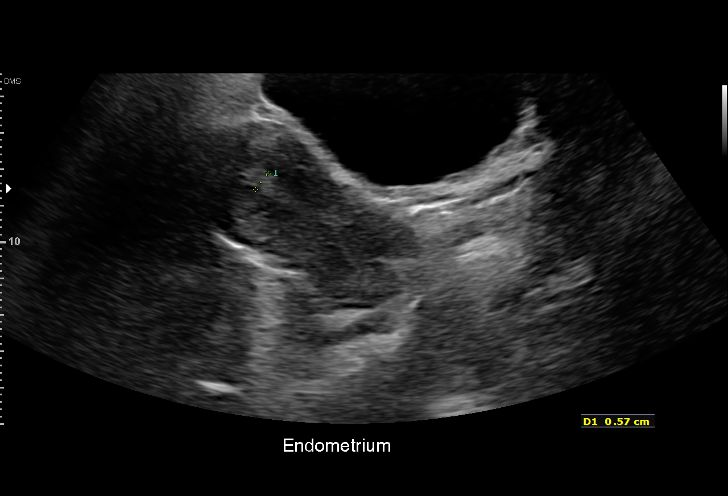
[im 9/37]
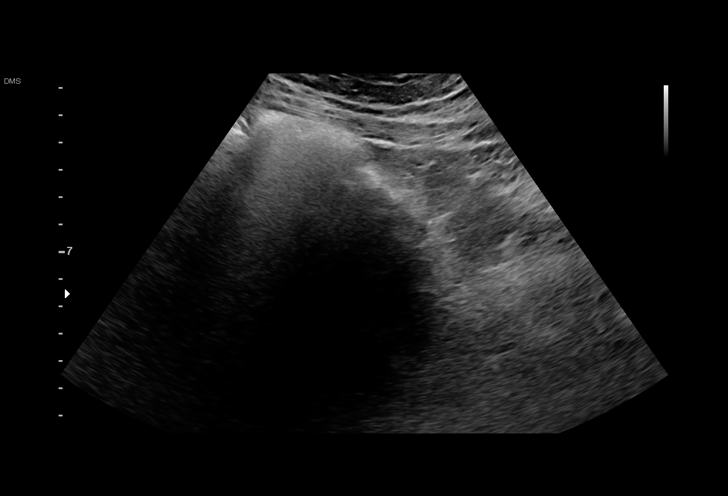
[im 11/37]
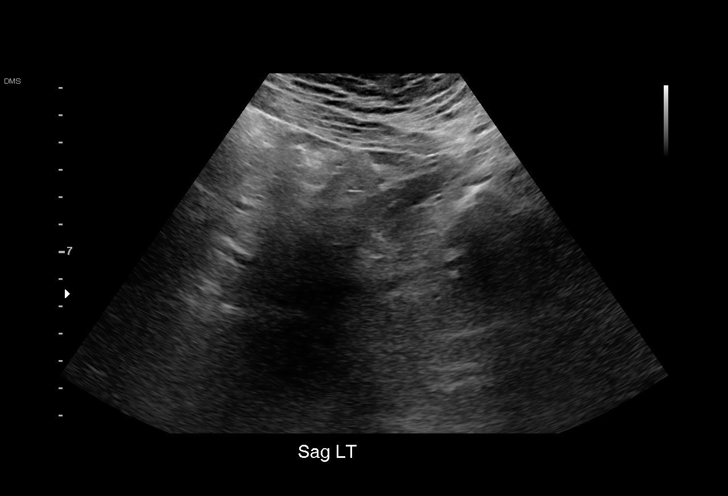
[im 14/37]
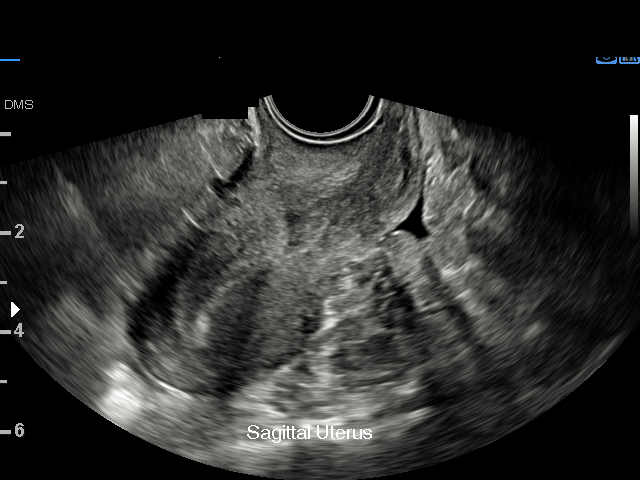
[im 17/37]
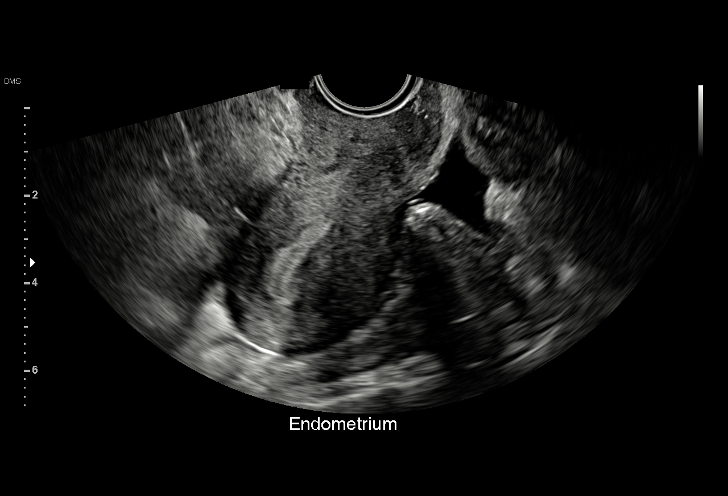
[im 19/37]
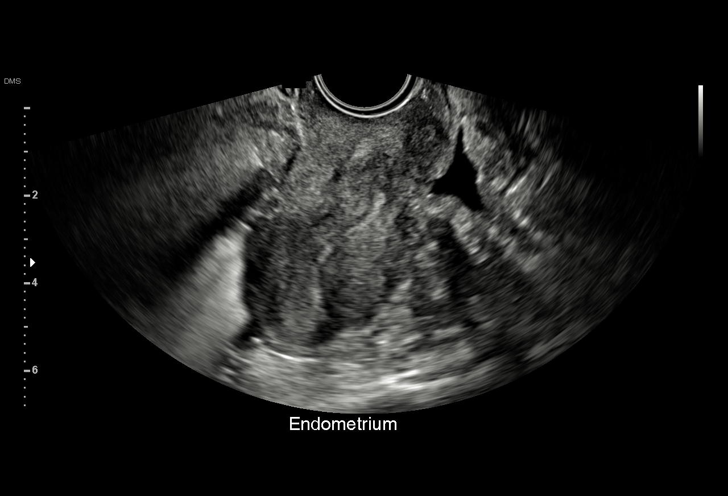
[im 21/37]
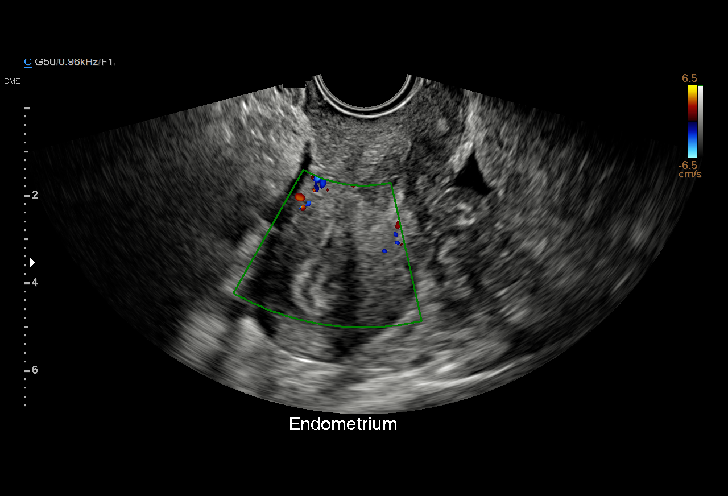
[im 23/37]
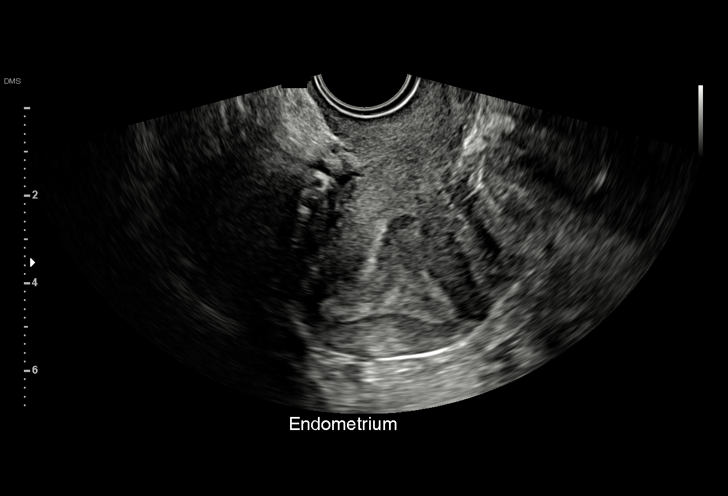
[im 26/37]
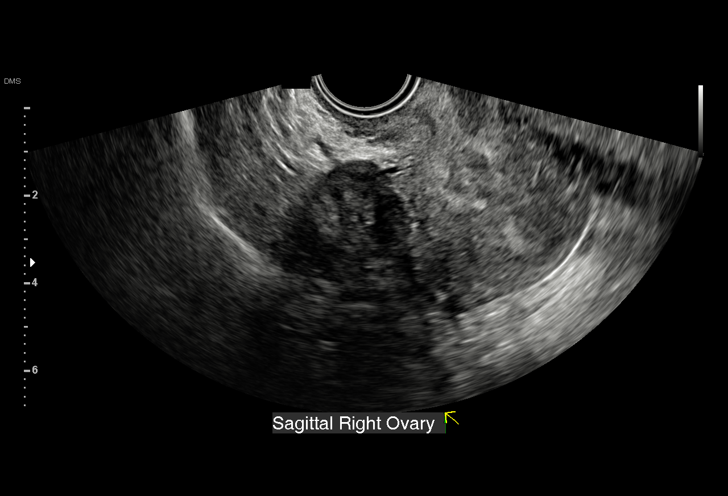
[im 29/37]
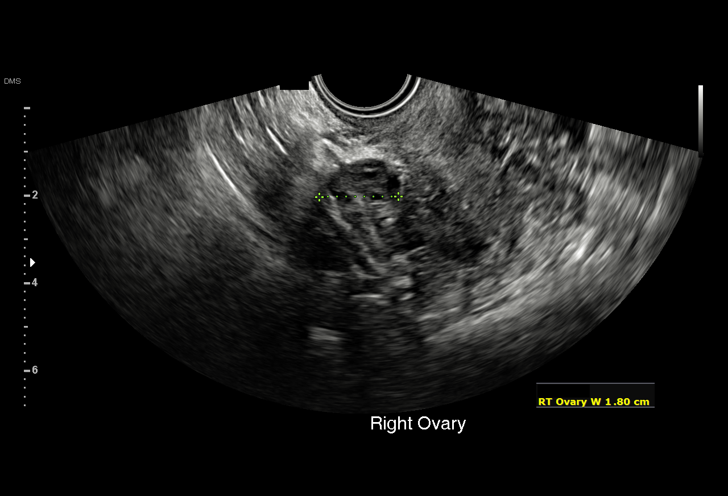
[im 31/37]
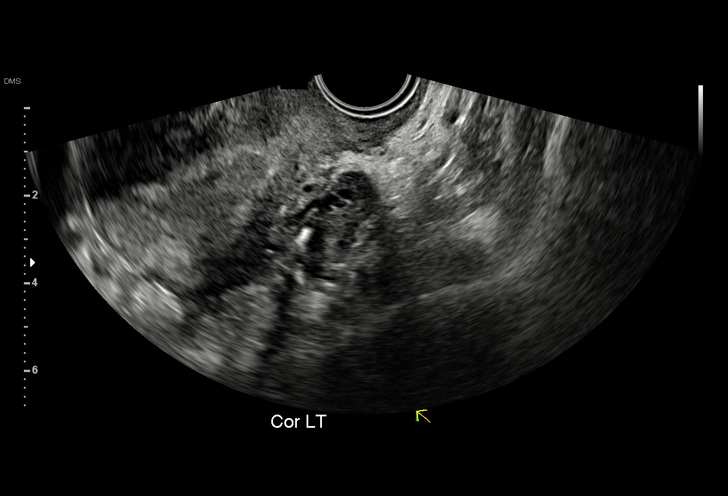
[im 34/37]
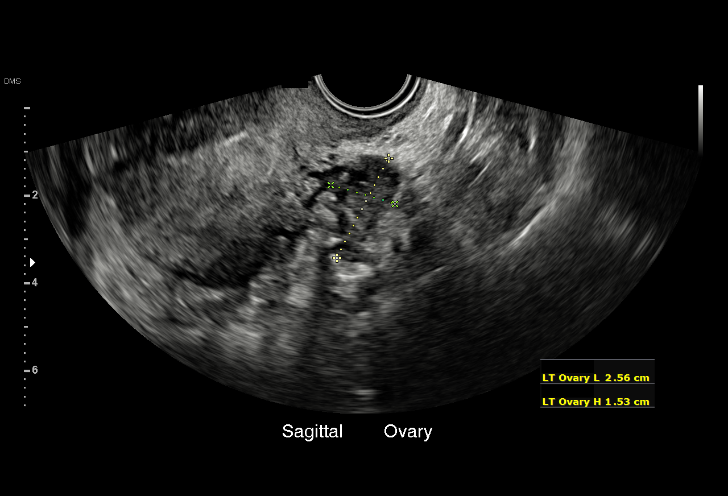
[im 37/37]
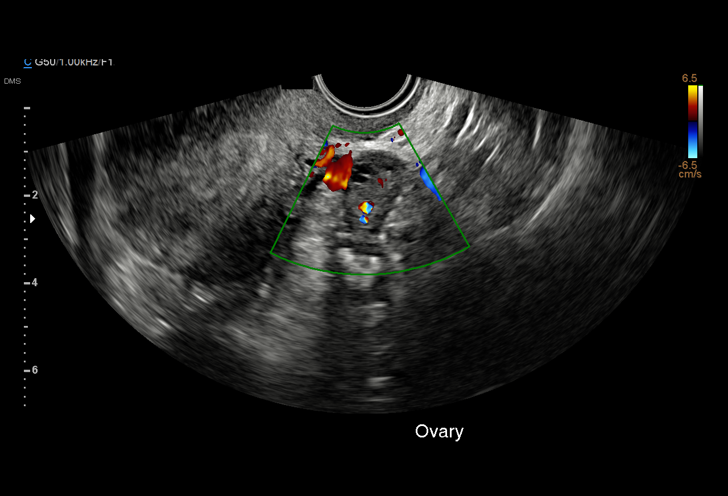

[15 of 28 positions shown; findings below may reference images not displayed]

FINDINGS: Intrauterine gestational sac: None visualize

Yolk sac:  N/A

Embryo:  N/A

Cardiac Activity: N/A

Heart Rate: N/A  bpm

MSD:   mm    w     d

CRL:    mm    w    d                  US EDC:

Subchorionic hemorrhage:  N/A

Maternal uterus/adnexae:

Normal uterine morphology without mass.

Heterogeneous appearance of the endometrial complex up to 8 mm
thick.

No distention of sac or endometrial fluid seen.

RIGHT ovary normal size and morphology, 2.4 x 2.2 x 1.8 cm.

LEFT ovary normal size and morphology, 2.6 x 1.5 x 1.6 cm.

Small amount of nonspecific free pelvic fluid.

No adnexal masses.
IMPRESSION: No intrauterine gestational sac identified.

Questionable visualization of a gestational sac on the previous
exam, not definitive.

Findings are compatible with pregnancy of unknown location.

Differential diagnosis includes early intrauterine pregnancy too
early to visualize, spontaneous abortion, and ectopic pregnancy.

Serial quantitative beta hCG and or followup ultrasound recommended
to definitively exclude ectopic pregnancy.

## 2020-07-31 NOTE — Telephone Encounter (Signed)
Will route to PCP 

## 2020-08-01 ENCOUNTER — Other Ambulatory Visit: Payer: Self-pay | Admitting: Nurse Practitioner

## 2020-08-01 DIAGNOSIS — L989 Disorder of the skin and subcutaneous tissue, unspecified: Secondary | ICD-10-CM

## 2020-08-13 NOTE — Telephone Encounter (Signed)
Message routed to PCP King, Crystal M, NP . Please Advise. 

## 2020-08-14 ENCOUNTER — Other Ambulatory Visit: Payer: Self-pay | Admitting: Nurse Practitioner

## 2020-08-14 DIAGNOSIS — Z3200 Encounter for pregnancy test, result unknown: Secondary | ICD-10-CM

## 2020-08-14 NOTE — Progress Notes (Signed)
   [facility] [address] [place], [place]  [address] Phone:  [phone]   Fax:  [phone]  Patient not feeling well with missed cycle. Home pregnancy test negative however requesting serum pregnancy test.   Orders placed

## 2020-08-16 ENCOUNTER — Other Ambulatory Visit: Payer: Medicaid Other

## 2020-08-16 ENCOUNTER — Other Ambulatory Visit: Payer: Self-pay

## 2020-08-16 DIAGNOSIS — Z3200 Encounter for pregnancy test, result unknown: Secondary | ICD-10-CM

## 2020-08-16 DIAGNOSIS — Z23 Encounter for immunization: Secondary | ICD-10-CM

## 2020-08-16 DIAGNOSIS — Z0184 Encounter for antibody response examination: Secondary | ICD-10-CM

## 2020-08-20 LAB — QUANTIFERON-TB GOLD PLUS
QuantiFERON Mitogen Value: >10 IU/mL
QuantiFERON Nil Value: 0 IU/mL
QuantiFERON TB1 Ag Value: 0 IU/mL
QuantiFERON TB2 Ag Value: 0 IU/mL
QuantiFERON-TB Gold Plus: NEGATIVE

## 2020-08-20 LAB — HCG, SERUM, QUALITATIVE: hCG,Beta Subunit,Qual,Serum: NEGATIVE m[IU]/mL (ref <6)

## 2020-08-29 ENCOUNTER — Ambulatory Visit: Payer: Medicaid Other | Admitting: Dermatology

## 2020-08-30 ENCOUNTER — Telehealth: Payer: Self-pay | Admitting: Nurse Practitioner

## 2020-08-30 NOTE — Telephone Encounter (Signed)
Referral Followup °

## 2021-03-31 ENCOUNTER — Emergency Department (HOSPITAL_COMMUNITY)
Admission: EM | Admit: 2021-03-31 | Discharge: 2021-03-31 | Disposition: A | Discharge status: Home / Self Care | Payer: Medicaid Other | Attending: Emergency Medicine | Admitting: Emergency Medicine

## 2021-03-31 ENCOUNTER — Emergency Department (HOSPITAL_COMMUNITY): Payer: Medicaid Other

## 2021-03-31 ENCOUNTER — Encounter (HOSPITAL_COMMUNITY): Payer: Self-pay | Admitting: *Deleted

## 2021-03-31 DIAGNOSIS — R059 Cough, unspecified: Secondary | ICD-10-CM | POA: Diagnosis present

## 2021-03-31 DIAGNOSIS — U071 COVID-19: Secondary | ICD-10-CM | POA: Insufficient documentation

## 2021-03-31 MED ORDER — DEXAMETHASONE SODIUM PHOSPHATE 10 MG/ML IJ SOLN
10.0000 mg | Freq: Once | INTRAMUSCULAR | Status: DC
  Filled 2021-03-31: qty 1

## 2021-03-31 MED ORDER — DEXAMETHASONE SODIUM PHOSPHATE 10 MG/ML IJ SOLN
10.0000 mg | Freq: Once | INTRAMUSCULAR | Status: AC
  Administered 2021-03-31: 10 mg via INTRAMUSCULAR

## 2021-03-31 MED ORDER — ACETAMINOPHEN 500 MG PO TABS
1000.0000 mg | ORAL_TABLET | Freq: Once | ORAL | Status: AC
  Administered 2021-03-31: 1000 mg via ORAL
  Filled 2021-03-31: qty 2

## 2021-03-31 MED ORDER — IBUPROFEN 800 MG PO TABS
800.0000 mg | ORAL_TABLET | Freq: Once | ORAL | Status: AC
  Administered 2021-03-31: 800 mg via ORAL
  Filled 2021-03-31: qty 1

## 2021-03-31 NOTE — ED Provider Notes (Signed)
[facility] EMERGENCY DEPARTMENT Provider Note   CSN: [id number] Arrival date & time: 03/31/21  1340     History Chief Complaint  Patient presents with   Cough    Jillian Richardson is a 25 y.o. female with PMHx seasonal allergies who presents for evaluation of cough.  Patient reports an approximately 9-day history of fevers, chills, myalgias, sore throat, generalized malaise.  She states that she was diagnosed with COVID-19 several days afterwards.  In the interim, she reports that her sore throat has worsened and she has developed a cough.  She has maintained normal p.o. intake.  She denies any nausea, vomiting, diarrhea, or constipation.  She states that she has been taking some allergy medication but she has not trialed any other medications or interventions for her symptoms such as Tylenol or NSAIDs.  She subsequently presented to the emergency department for further evaluation.      Past Medical History:  Diagnosis Date   Allergic rhinitis 10/23/2013   Allergy     Patient Active Problem List   Diagnosis Date Noted   Miscarriage 07/18/2018   Sprain of right wrist 01/18/2017   Wrist pain, acute, right 01/18/2017   Weight gain 01/18/2017   Annual physical exam 10/04/2015   Depression 12/05/2013   Costochondritis 12/05/2013   Deliberate self-cutting 12/05/2013   Obesity, unspecified 12/05/2013   Allergic rhinitis 10/23/2013    Past Surgical History:  Procedure Laterality Date   NO PAST SURGERIES       OB History     Gravida  2   Para      Term      Preterm      AB      Living         SAB      IAB      Ectopic      Multiple      Live Births              Family History  Problem Relation Age of Onset   Diabetes Mother    Diabetes Father     Social History   Tobacco Use   Smoking status: Never   Smokeless tobacco: Never  Vaping Use   Vaping Use: Never used  Substance Use Topics   Alcohol use: No   Drug use: No     Home Medications Prior to Admission medications   Not on File    Allergies    Patient has no known allergies.  Review of Systems   Review of Systems  Respiratory:  Positive for cough.    Physical Exam Updated Vital Signs BP 113/67   Pulse 75   Temp 98.4 F (36.9 C) (Oral)   Resp 18   LMP 03/31/2021   SpO2 97%   Physical Exam Vitals and nursing note reviewed.  Constitutional:      General: She is not in acute distress.    Appearance: Normal appearance. She is well-developed. She is not ill-appearing.  HENT:     Head: Normocephalic and atraumatic.     Mouth/Throat:     Comments: Tonsillar erythema but no exudates.  Uvula is midline.  No visible RPA or PTA. Eyes:     Conjunctiva/sclera: Conjunctivae normal.  Cardiovascular:     Rate and Rhythm: Normal rate and regular rhythm.     Heart sounds: No murmur heard. Pulmonary:     Effort: Pulmonary effort is normal. No respiratory distress.     Breath sounds: Normal  breath sounds.  Abdominal:     Palpations: Abdomen is soft.     Tenderness: There is no abdominal tenderness.  Musculoskeletal:     Cervical back: Normal range of motion and neck supple.  Skin:    General: Skin is warm and dry.     Capillary Refill: Capillary refill takes less than 2 seconds.  Neurological:     General: No focal deficit present.     Mental Status: She is alert and oriented to person, place, and time. Mental status is at baseline.    ED Results / Procedures / Treatments   Labs (all labs ordered are listed, but only abnormal results are displayed) Labs Reviewed - No data to display  EKG None  Radiology DG Chest 2 View  Result Date: 03/31/2021 CLINICAL DATA:  COVID positive, cough, sore throat, body aches EXAM: CHEST - 2 VIEW COMPARISON:  Chest radiograph 03/24/2021 FINDINGS: The cardiomediastinal silhouette is normal. The lungs clear, without focal consolidation or pulmonary edema. There is no pleural effusion or pneumothorax.  There is no acute osseous abnormality. IMPRESSION: Stable chest with no radiographic evidence of acute cardiopulmonary process. Electronically Signed   By: Lesia Hausen M.D.   On: 03/31/2021 15:32    Procedures Procedures   Medications Ordered in ED Medications  acetaminophen (TYLENOL) tablet 1,000 mg (1,000 mg Oral Given 03/31/21 2208)  ibuprofen (ADVIL) tablet 800 mg (800 mg Oral Given 03/31/21 2209)  dexamethasone (DECADRON) injection 10 mg (10 mg Intramuscular Given 03/31/21 2209)    ED Course  I have reviewed the triage vital signs and the nursing notes.  Pertinent labs & imaging results that were available during my care of the patient were reviewed by me and considered in my medical decision making (see chart for details).    MDM Rules/Calculators/A&P                          25 y.o. female with past medical history as above who presents for evaluation of cough, sore throat, and myalgias in the context of active COVID-19 infection.  Patient is currently on day 9 of illness.  She states that she has been taking minimal medications to treat her symptoms at home.  She has not trialed any Tylenol or NSAIDs.  Patient is afebrile and hemodynamically stable here.  Exam is notable for mild tonsillar erythema without exudates.  No evidence of respiratory distress.  Chest x-ray was obtained which demonstrates no acute cardiopulmonary abnormality.  No evidence of bacterial superinfection.  Patient was treated with Tylenol, Advil, and Decadron.  No indication for further emergent evaluation at this time.  Patient was advised to proceed with symptomatic management at home.  Discussed impression and recommendations with the patient at bedside prior to discharge.  Patient voiced understanding.   Final Clinical Impression(s) / ED Diagnoses Final diagnoses:  COVID-19 virus infection    Rx / DC Orders ED Discharge Orders     None        Holley Dexter, MD 04/01/21 1806    Blane Ohara,  MD 04/02/21 [phone]

## 2021-03-31 NOTE — ED Provider Notes (Signed)
Emergency Medicine Provider Triage Evaluation Note  [place] , a 25 y.o. female  was evaluated in triage.  Pt complains of covid sx one week. Tested positive last Saturday and thought she should feel better but she is not.  Review of Systems  Positive: Fever, congestion, sob Negative: Cp, palpit  Physical Exam  BP 120/69   Pulse 86   Temp 98.4 F (36.9 C) (Oral)   Resp 16   SpO2 99%  Gen:   Awake, no distress   Resp:  Normal effort  MSK:   Moves extremities without difficulty  Other:  RRR  Medical Decision Making  Medically screening exam initiated at 2:29 PM.  Appropriate orders placed.  [facility] was informed that the remainder of the evaluation will be completed by another provider, this initial triage assessment does not replace that evaluation, and the importance of remaining in the ED until their evaluation is complete.     Woodroe Chen 03/31/21 1430    Gloris Manchester, MD 04/02/21 [phone]

## 2021-03-31 NOTE — ED Notes (Signed)
RN reviewed discharge instructions w/ pt, no further questions

## 2021-03-31 NOTE — ED Triage Notes (Signed)
Pt reports cough, sore throat, bodyaches for over the past week. Had +covid test. No acute resp distress noted at triage.

## 2022-01-31 ENCOUNTER — Emergency Department (HOSPITAL_COMMUNITY): Payer: Medicaid Other

## 2022-01-31 ENCOUNTER — Encounter (HOSPITAL_COMMUNITY): Payer: Self-pay

## 2022-01-31 ENCOUNTER — Emergency Department (HOSPITAL_COMMUNITY)
Admission: EM | Admit: 2022-01-31 | Discharge: 2022-01-31 | Disposition: A | Discharge status: Home / Self Care | Payer: Medicaid Other | Attending: Emergency Medicine | Admitting: Emergency Medicine

## 2022-01-31 ENCOUNTER — Other Ambulatory Visit: Payer: Self-pay

## 2022-01-31 DIAGNOSIS — M25561 Pain in right knee: Secondary | ICD-10-CM | POA: Insufficient documentation

## 2022-01-31 DIAGNOSIS — R202 Paresthesia of skin: Secondary | ICD-10-CM | POA: Diagnosis not present

## 2022-01-31 DIAGNOSIS — R072 Precordial pain: Secondary | ICD-10-CM | POA: Diagnosis not present

## 2022-01-31 DIAGNOSIS — R0789 Other chest pain: Secondary | ICD-10-CM | POA: Diagnosis present

## 2022-01-31 LAB — TROPONIN I (HIGH SENSITIVITY): Troponin I (High Sensitivity): <2 ng/L (ref <18)

## 2022-01-31 LAB — CBC
HCT: 40 % (ref 36.0–46.0)
Hemoglobin: 13.5 g/dL (ref 12.0–15.0)
MCH: 32.5 pg (ref 26.0–34.0)
MCHC: 33.8 g/dL (ref 30.0–36.0)
MCV: 96.2 fL (ref 80.0–100.0)
Platelets: 307 10*3/uL (ref 150–400)
RBC: 4.16 MIL/uL (ref 3.87–5.11)
RDW: 12 % (ref 11.5–15.5)
WBC: 9.4 10*3/uL (ref 4.0–10.5)
nRBC: 0 % (ref 0.0–0.2)

## 2022-01-31 LAB — BASIC METABOLIC PANEL
Anion gap: 9 (ref 5–15)
BUN: 14 mg/dL (ref 6–20)
CO2: 24 mmol/L (ref 22–32)
Calcium: 9.3 mg/dL (ref 8.9–10.3)
Chloride: 105 mmol/L (ref 98–111)
Creatinine, Ser: 0.61 mg/dL (ref 0.44–1.00)
GFR, Estimated: >60 mL/min (ref >60)
Glucose, Bld: 84 mg/dL (ref 70–99)
Potassium: 3.9 mmol/L (ref 3.5–5.1)
Sodium: 138 mmol/L (ref 135–145)

## 2022-01-31 LAB — I-STAT BETA HCG BLOOD, ED (MC, WL, AP ONLY): I-stat hCG, quantitative: <5 m[IU]/mL (ref <5)

## 2022-01-31 LAB — D-DIMER, QUANTITATIVE: D-Dimer, Quant: 0.28 ug/mL-FEU (ref 0.00–0.50)

## 2022-01-31 NOTE — ED Provider Notes (Signed)
[facility] EMERGENCY DEPARTMENT Provider Note   CSN: [id number] Arrival date & time: 01/31/22  1405     History  No chief complaint on file.   Jillian Richardson is a 26 y.o. female.  Patient with no significant past medical history presents to the emergency department for evaluation of chest pain.  Patient describes a mid chest pressure worse with movement and deep breathing.  Symptoms started around 9 AM today.  Patient states that she was getting in her car when the symptoms started.  She was not doing anything strenuous.  Symptoms persisted throughout the day.  No treatments prior to arrival.  In addition she had tingling in her arms and pain in her right knee.  Patient states that she drives about 30 miles every day.  She has had right knee pain over the past several months that is exacerbated when she drives a lot.  She states that the knee pain radiates up into her right thigh.  No lower extremity swelling.  No history of hypertension, diabetes, high cholesterol, smoking.  Patient has family history of MI in her father who is in his 64s.  Patient denies other risk factors for pulmonary embolism including: history of DVT/PE/other blood clots, use of exogenous hormones, recent immobilizations, recent surgery, recent travel (>4hr segment), malignancy, hemoptysis.          Home Medications Prior to Admission medications   Not on File      Allergies    Patient has no known allergies.    Review of Systems   Review of Systems  Physical Exam Updated Vital Signs BP 121/65 (BP Location: Right Arm)   Pulse 78   Temp 98.7 F (37.1 C) (Oral)   Resp 16   SpO2 99%   Physical Exam Vitals and nursing note reviewed.  Constitutional:      Appearance: She is well-developed. She is not diaphoretic.  HENT:     Head: Normocephalic and atraumatic.     Nose: Nose normal.     Mouth/Throat:     Mouth: Mucous membranes are not dry.  Eyes:     Conjunctiva/sclera:  Conjunctivae normal.  Neck:     Vascular: Normal carotid pulses. No JVD.     Trachea: Trachea normal. No tracheal deviation.  Cardiovascular:     Rate and Rhythm: Normal rate and regular rhythm.     Pulses: No decreased pulses.          Radial pulses are 2+ on the right side and 2+ on the left side.     Heart sounds: Normal heart sounds, S1 normal and S2 normal. No murmur heard. Pulmonary:     Effort: Pulmonary effort is normal. No respiratory distress.     Breath sounds: No wheezing.  Chest:     Chest wall: No tenderness.  Abdominal:     General: Bowel sounds are normal.     Palpations: Abdomen is soft.     Tenderness: There is no abdominal tenderness. There is no guarding or rebound.  Musculoskeletal:        General: Normal range of motion.     Cervical back: Normal range of motion and neck supple. No muscular tenderness.  Skin:    General: Skin is warm and dry.     Coloration: Skin is not pale.  Neurological:     Mental Status: She is alert.     ED Results / Procedures / Treatments   Labs (all labs ordered are listed, but  only abnormal results are displayed) Labs Reviewed  BASIC METABOLIC PANEL  CBC  D-DIMER, QUANTITATIVE  I-STAT BETA HCG BLOOD, ED ([facility], [facility], [facility] ONLY)  TROPONIN I (HIGH SENSITIVITY)    EKG EKG Interpretation  Date/Time:  Saturday January 31 2022 14:51:35 EDT Ventricular Rate:  77 PR Interval:  156 QRS Duration: 78 QT Interval:  382 QTC Calculation: 432 R Axis:   78 Text Interpretation: Normal sinus rhythm Normal ECG When compared with ECG of 23-Jun-2010 14:59, PREVIOUS ECG IS PRESENT Confirmed by Alvester Chou [phone]) on 01/31/2022 4:16:29 PM  Radiology DG Chest 2 View  Result Date: 01/31/2022 CLINICAL DATA:  Chest pain EXAM: CHEST - 2 VIEW COMPARISON:  03/31/2021 FINDINGS: Cardiomediastinal silhouette and pulmonary vasculature are within normal limits. Lungs are clear. IMPRESSION: No acute cardiopulmonary process. Electronically Signed   By:  Acquanetta Belling M.D.   On: 01/31/2022 15:53    Procedures Procedures    Medications Ordered in ED Medications - No data to display  ED Course/ Medical Decision Making/ A&P    Patient seen and examined. History obtained directly from patient. Work-up including labs, imaging, EKG ordered in triage, if performed, were reviewed.    Labs/EKG: Independently reviewed and interpreted.  This included: CBC unremarkable; BMP unremarkable; troponin less than 2; negative pregnancy; EKG without acute findings.  Imaging: Independently visualized and interpreted.  This included: Chest x-ray, agree negative.  Medications/Fluids: None ordered  Most recent vital signs reviewed and are as follows: BP 121/65 (BP Location: Right Arm)   Pulse 78   Temp 98.7 F (37.1 C) (Oral)   Resp 16   SpO2 99%   Initial impression: Atypical chest pain.  Patient does have ongoing right knee pain that radiates into the right thigh.  Overall, her exam is not significantly consistent with DVT, but did have a shared decision making regarding D-dimer to rule out the possibility of a blood clot.  She agrees to proceed with D-dimer.  6:10 PM Reassessment performed. Patient appears stable.  Labs personally reviewed and interpreted including: D-dimer is normal  Reviewed pertinent lab work and imaging with patient at bedside. Questions answered.   Most current vital signs reviewed and are as follows: BP 121/65 (BP Location: Right Arm)   Pulse 78   Temp 98.7 F (37.1 C) (Oral)   Resp 16   SpO2 99%   Plan: Discharge to home.   Prescriptions written for: None  Other home care instructions discussed: Avoidance of foods or activities which make the symptoms worse.  ED return instructions discussed: Return and follow-up instructions: I encouraged patient to return to ED with severe chest pain, especially if the pain is crushing or pressure-like and spreads to the arms, back, neck, or jaw, or if they have associated  sweating, vomiting, or shortness of breath with the pain, or significant pain with activity. We discussed that the evaluation here today indicates a low-risk of serious cause of chest pain, including heart trouble or a blood clot, but no evaluation is perfect and chest pain can evolve with time. The patient verbalized understanding and agreed.  I encouraged patient to follow-up with their provider in the next 72 hours for recheck.                            Medical Decision Making Amount and/or Complexity of Data Reviewed Labs: ordered. Radiology: ordered.   For this patient's complaint of chest pain, the following emergent conditions were considered  on the differential diagnosis: acute coronary syndrome, pulmonary embolism, pneumothorax, myocarditis, pericardial tamponade, aortic dissection, thoracic aortic aneurysm complication, esophageal perforation.   Other causes were also considered including: gastroesophageal reflux disease, musculoskeletal pain including costochondritis, pneumonia/pleurisy, herpes zoster, pericarditis.  In regards to possibility of ACS, patient has atypical features of pain, non-ischemic and unchanged EKG and negative troponin(s). Heart score was calculated to be 0.   In regards to possibility of PE, d-dimer is neg and pt has low-risk wells criteria.   The patient's vital signs, pertinent lab work and imaging were reviewed and interpreted as discussed in the ED course. Hospitalization was considered for further testing, treatments, or serial exams/observation. However as patient is well-appearing, has a stable exam, and reassuring studies today, I do not feel that they warrant admission at this time. This plan was discussed with the patient who verbalizes agreement and comfort with this plan and seems reliable and able to return to the Emergency Department with worsening or changing symptoms.          Final Clinical Impression(s) / ED Diagnoses Final diagnoses:   Precordial pain    Rx / DC Orders ED Discharge Orders     None         Carlisle Cater, Hershal Coria 01/31/22 1812    Wyvonnia Dusky, MD 01/31/22 1902

## 2022-01-31 NOTE — Discharge Instructions (Signed)
Please read and follow all provided instructions.  Your diagnoses today include:  1. Precordial pain     Tests performed today include: An EKG of your heart A chest x-ray Cardiac enzymes: a blood test for heart muscle damage Blood counts and electrolytes D-dimer: screening test for blood clot was negative Vital signs. See below for your results today.   Medications prescribed:  None  Take any prescribed medications only as directed.  Follow-up instructions: Please follow-up with your primary care provider as soon as you can for further evaluation of your symptoms.   Return instructions:  SEEK IMMEDIATE MEDICAL ATTENTION IF: You have severe chest pain, especially if the pain is crushing or pressure-like and spreads to the arms, back, neck, or jaw, or if you have sweating, nausea or vomiting, or trouble with breathing. THIS IS AN EMERGENCY. Do not wait to see if the pain will go away. Get medical help at once. Call 911. DO NOT drive yourself to the hospital.  Your chest pain gets worse and does not go away after a few minutes of rest.  You have an attack of chest pain lasting longer than what you usually experience.  You have significant dizziness, if you pass out, or have trouble walking.  You have chest pain not typical of your usual pain for which you originally saw your caregiver.  You have any other emergent concerns regarding your health.  Additional Information: Chest pain comes from many different causes. Your caregiver has diagnosed you as having chest pain that is not specific for one problem, but does not require admission.  You are at low risk for an acute heart condition or other serious illness.   Your vital signs today were: BP 121/65 (BP Location: Right Arm)   Pulse 78   Temp 98.7 F (37.1 C) (Oral)   Resp 16   SpO2 99%  If your blood pressure (BP) was elevated above 135/85 this visit, please have this repeated by your doctor within one  month. --------------

## 2022-01-31 NOTE — ED Triage Notes (Signed)
Patient complains of cp that started this am that is worse with movement and inspiration, no associated symptoms

## 2022-05-06 ENCOUNTER — Other Ambulatory Visit: Payer: Self-pay

## 2022-05-06 ENCOUNTER — Emergency Department (HOSPITAL_COMMUNITY)
Admission: EM | Admit: 2022-05-06 | Discharge: 2022-05-06 | Disposition: A | Discharge status: Home / Self Care | Payer: Medicaid Other | Attending: Emergency Medicine | Admitting: Emergency Medicine

## 2022-05-06 ENCOUNTER — Emergency Department (HOSPITAL_COMMUNITY): Payer: Medicaid Other

## 2022-05-06 ENCOUNTER — Encounter (HOSPITAL_COMMUNITY): Payer: Self-pay | Admitting: *Deleted

## 2022-05-06 DIAGNOSIS — B9689 Other specified bacterial agents as the cause of diseases classified elsewhere: Secondary | ICD-10-CM | POA: Insufficient documentation

## 2022-05-06 DIAGNOSIS — N888 Other specified noninflammatory disorders of cervix uteri: Secondary | ICD-10-CM | POA: Diagnosis not present

## 2022-05-06 DIAGNOSIS — N760 Acute vaginitis: Secondary | ICD-10-CM | POA: Diagnosis not present

## 2022-05-06 DIAGNOSIS — R102 Pelvic and perineal pain: Secondary | ICD-10-CM | POA: Diagnosis present

## 2022-05-06 DIAGNOSIS — N390 Urinary tract infection, site not specified: Secondary | ICD-10-CM | POA: Insufficient documentation

## 2022-05-06 LAB — COMPREHENSIVE METABOLIC PANEL
ALT: 14 U/L (ref 0–44)
AST: 16 U/L (ref 15–41)
Albumin: 4.3 g/dL (ref 3.5–5.0)
Alkaline Phosphatase: 56 U/L (ref 38–126)
Anion gap: 6 (ref 5–15)
BUN: 12 mg/dL (ref 6–20)
CO2: 27 mmol/L (ref 22–32)
Calcium: 9.7 mg/dL (ref 8.9–10.3)
Chloride: 103 mmol/L (ref 98–111)
Creatinine, Ser: 0.57 mg/dL (ref 0.44–1.00)
GFR, Estimated: >60 mL/min (ref >60)
Glucose, Bld: 88 mg/dL (ref 70–99)
Potassium: 4 mmol/L (ref 3.5–5.1)
Sodium: 136 mmol/L (ref 135–145)
Total Bilirubin: 0.6 mg/dL (ref 0.3–1.2)
Total Protein: 7.8 g/dL (ref 6.5–8.1)

## 2022-05-06 LAB — URINALYSIS, ROUTINE W REFLEX MICROSCOPIC
Bilirubin Urine: NEGATIVE
Glucose, UA: NEGATIVE mg/dL
Ketones, ur: NEGATIVE mg/dL
Nitrite: NEGATIVE
Protein, ur: NEGATIVE mg/dL
Specific Gravity, Urine: 1.008 (ref 1.005–1.030)
pH: 6 (ref 5.0–8.0)

## 2022-05-06 LAB — CBC WITH DIFFERENTIAL/PLATELET
Abs Immature Granulocytes: 0.03 10*3/uL (ref 0.00–0.07)
Basophils Absolute: 0 10*3/uL (ref 0.0–0.1)
Basophils Relative: 1 %
Eosinophils Absolute: 0.2 10*3/uL (ref 0.0–0.5)
Eosinophils Relative: 2 %
HCT: 41.5 % (ref 36.0–46.0)
Hemoglobin: 13.8 g/dL (ref 12.0–15.0)
Immature Granulocytes: 0 %
Lymphocytes Relative: 23 %
Lymphs Abs: 1.8 10*3/uL (ref 0.7–4.0)
MCH: 31.9 pg (ref 26.0–34.0)
MCHC: 33.3 g/dL (ref 30.0–36.0)
MCV: 95.8 fL (ref 80.0–100.0)
Monocytes Absolute: 0.3 10*3/uL (ref 0.1–1.0)
Monocytes Relative: 4 %
Neutro Abs: 5.6 10*3/uL (ref 1.7–7.7)
Neutrophils Relative %: 70 %
Platelets: 242 10*3/uL (ref 150–400)
RBC: 4.33 MIL/uL (ref 3.87–5.11)
RDW: 12.5 % (ref 11.5–15.5)
WBC: 8 10*3/uL (ref 4.0–10.5)
nRBC: 0 % (ref 0.0–0.2)

## 2022-05-06 LAB — WET PREP, GENITAL
Sperm: NONE SEEN
Trich, Wet Prep: NONE SEEN
WBC, Wet Prep HPF POC: >=10 — AB (ref <10)
Yeast Wet Prep HPF POC: NONE SEEN

## 2022-05-06 LAB — I-STAT BETA HCG BLOOD, ED (MC, WL, AP ONLY): I-stat hCG, quantitative: <5 m[IU]/mL (ref <5)

## 2022-05-06 LAB — HIV ANTIBODY (ROUTINE TESTING W REFLEX): HIV Screen 4th Generation wRfx: NONREACTIVE

## 2022-05-06 LAB — LIPASE, BLOOD: Lipase: 25 U/L (ref 11–51)

## 2022-05-06 LAB — GROUP A STREP BY PCR: Group A Strep by PCR: NOT DETECTED

## 2022-05-06 MED ORDER — METRONIDAZOLE 500 MG PO TABS: 500.0000 mg | ORAL_TABLET | Freq: Two times a day (BID) | ORAL | 0 refills | Status: DC

## 2022-05-06 MED ORDER — CEPHALEXIN 500 MG PO CAPS: 500.0000 mg | ORAL_CAPSULE | Freq: Two times a day (BID) | ORAL | 0 refills | Status: DC

## 2022-05-06 MED ORDER — ONDANSETRON 4 MG PO TBDP: 4.0000 mg | ORAL_TABLET | Freq: Three times a day (TID) | ORAL | 0 refills | Status: DC | PRN

## 2022-05-06 MED ORDER — METRONIDAZOLE 500 MG PO TABS
500.0000 mg | ORAL_TABLET | Freq: Once | ORAL | Status: AC
  Administered 2022-05-06: 500 mg via ORAL
  Filled 2022-05-06: qty 1

## 2022-05-06 MED ORDER — ONDANSETRON 4 MG PO TBDP
4.0000 mg | ORAL_TABLET | Freq: Once | ORAL | Status: AC
  Administered 2022-05-06: 4 mg via ORAL
  Filled 2022-05-06: qty 1

## 2022-05-06 MED ORDER — CEPHALEXIN 500 MG PO CAPS
500.0000 mg | ORAL_CAPSULE | Freq: Once | ORAL | Status: AC
  Administered 2022-05-06: 500 mg via ORAL
  Filled 2022-05-06: qty 1

## 2022-05-06 NOTE — ED Provider Triage Note (Signed)
Emergency Medicine Provider Triage Evaluation Note  [facility] , a 26 y.o. female  was evaluated in triage.  Pt complains of pelvic pain x 3 months. No previous eval. Constant worse with intercourse, plus post coital bleeding. Has a gynecologist in [place], has not seen her.  + upper quadrant sharp abdominal pain. X 2 months + constant w/ nausea. No aggravating or alleviating factors + sore throat x 2 weeks worse with swallowing no fever  Review of Systems  Positive: Abd pain Negative: Fever.  Physical Exam  BP 113/66 (BP Location: Left Arm)   Pulse 75   Temp 98.8 F (37.1 C) (Oral)   Resp 16   Ht 4\' 11"  (1.499 m)   Wt 72.6 kg   SpO2 98%   BMI 32.32 kg/m  Gen:   Awake, no distress   Resp:  Normal effort  MSK:   Moves extremities without difficulty  Other:  No distension  Medical Decision Making  Medically screening exam initiated at 12:36 PM.  Appropriate orders placed.  [facility] was informed that the remainder of the evaluation will be completed by another provider, this initial triage assessment does not replace that evaluation, and the importance of remaining in the ED until their evaluation is complete.   Work up initiated.   Margarita Mail, PA-C 05/06/22 1239

## 2022-05-06 NOTE — Discharge Instructions (Signed)
You were seen for pelvic pain.  Follow-up with your OB/GYN regarding your visit to the ER today.  Your ultrasound overall was reassuring and showed no concerning findings.  You did test positive for BV.  Your urine suggest infection.  Follow-up with your MyChart results for the STD test.  Practice safe sex until getting those results.  Take Tylenol and ibuprofen as needed for pain.  You can take the Zofran as needed for nausea or vomiting.  Take the antibiotics as prescribed to treat the BV and UTI.  Come back for any severe worsening pain, abnormal discharge, high fevers, uncontrollable nausea and vomiting, or any other symptoms concerning to you.

## 2022-05-06 NOTE — ED Provider Notes (Signed)
[facility] DEPT Provider Note   CSN: [id number] Arrival date & time: 05/06/22  1145     History  Chief Complaint  Patient presents with   Abdominal Pain   Pelvic Pain    Jillian Richardson is a 26 y.o. female.  With PMH of allergic rhinitis otherwise healthy who presents with lower pelvic pain that has been ongoing for the past couple months worse with intercourse.  She says she has not seen her PCP or OB/GYN for these issues.  She has not seen her OB/GYN in a while.  She has a history of 2 miscarriages.  Her last menstrual period was last week.  She denies any abnormal discharge, burning or itching.  Denies any dysuria or hematuria.  She has had no fevers, nausea, vomiting or other complaints.  She denies any history of STDs.  She is currently sexually active with 1 partner.  She denies any history of abnormal Pap smear.   Abdominal Pain Pelvic Pain Associated symptoms include abdominal pain.       Home Medications Prior to Admission medications   Medication Sig Start Date End Date Taking? Authorizing Provider  cephALEXin (KEFLEX) 500 MG capsule Take 1 capsule (500 mg total) by mouth 2 (two) times daily for 5 days. 05/06/22 05/11/22 Yes Elgie Congo, MD  metroNIDAZOLE (FLAGYL) 500 MG tablet Take 1 tablet (500 mg total) by mouth 2 (two) times daily. 05/06/22  Yes Elgie Congo, MD  ondansetron (ZOFRAN-ODT) 4 MG disintegrating tablet Take 1 tablet (4 mg total) by mouth every 8 (eight) hours as needed for nausea or vomiting. 05/06/22  Yes Elgie Congo, MD      Allergies    Patient has no known allergies.    Review of Systems   Review of Systems  Gastrointestinal:  Positive for abdominal pain.  Genitourinary:  Positive for pelvic pain.    Physical Exam Updated Vital Signs BP 103/65 (BP Location: Left Arm)   Pulse 70   Temp 98.2 F (36.8 C) (Oral)   Resp 18   Ht 4\' 11"  (1.499 m)   Wt 72.6 kg   SpO2 100%   BMI 32.32 kg/m   Physical Exam Constitutional: Alert and oriented. Well appearing and in no distress. Eyes: Conjunctivae are normal. ENT      Head: Normocephalic and atraumatic.      Nose: No congestion.      Mouth/Throat: Mucous membranes are moist.      Neck: No stridor. Cardiovascular: S1, S2,  Normal and symmetric distal pulses are present in all extremities.Warm and well perfused. Respiratory: Normal respiratory effort. Breath sounds are normal. Gastrointestinal: Soft and nondistended with mild suprapubic tenderness GU:  chaperoned with nurse.  Normal external genitalia with no lesions.  White clear discharge within the vaginal fornix and follow-up.  CMT present with friable cervix. Musculoskeletal: Normal range of motion in all extremities. Neurologic: Normal speech and language. No gross focal neurologic deficits are appreciated. Skin: Skin is warm, dry and intact. No rash noted. Psychiatric: Mood and affect are normal. Speech and behavior are normal.  ED Results / Procedures / Treatments   Labs (all labs ordered are listed, but only abnormal results are displayed) Labs Reviewed  WET PREP, GENITAL - Abnormal; Notable for the following components:      Result Value   Clue Cells Wet Prep HPF POC PRESENT (*)    WBC, Wet Prep HPF POC >=10 (*)    All other components within normal limits  URINALYSIS, ROUTINE W REFLEX MICROSCOPIC - Abnormal; Notable for the following components:   Color, Urine STRAW (*)    Hgb urine dipstick MODERATE (*)    Leukocytes,Ua MODERATE (*)    Bacteria, UA RARE (*)    All other components within normal limits  GROUP A STREP BY PCR  CBC WITH DIFFERENTIAL/PLATELET  COMPREHENSIVE METABOLIC PANEL  LIPASE, BLOOD  RPR  HIV ANTIBODY (ROUTINE TESTING W REFLEX)  I-STAT BETA HCG BLOOD, ED ([facility], [facility], [facility] ONLY)  GC/CHLAMYDIA PROBE AMP ([place]) NOT AT [facility]    EKG None  Radiology US Pelvis Complete  Result Date: 05/06/2022 CLINICAL DATA:  Pelvic pain and abnormal  vaginal bleeding. EXAM: TRANSABDOMINAL AND TRANSVAGINAL ULTRASOUND OF PELVIS TECHNIQUE: Both transabdominal and transvaginal ultrasound examinations of the pelvis were performed. Transabdominal technique was performed for global imaging of the pelvis including uterus, ovaries, adnexal regions, and pelvic cul-de-sac. It was necessary to proceed with endovaginal exam following the transabdominal exam to visualize the uterus, endometrium, left ovary and bilateral adnexa. COMPARISON:  March 08, 2019 FINDINGS: Uterus Measurements: 7.1 cm x 3.6 cm x 3.1 cm = volume: 42.38 mL. No fibroids or other mass visualized. Endometrium Thickness: 4.1.  No focal abnormality visualized. Right ovary The right ovary is not visualized secondary to overlying bowel gas. Left ovary Measurements: 1.9 cm x 1.7 cm x 1.8 cm = volume: 2.9 mL. Normal appearance/no adnexal mass. Other findings A small amount of pelvic free fluid is noted. IMPRESSION: 1. Nonvisualization of the right ovary secondary to overlying bowel gas. 2. Normal ultrasonographic appearance of the uterus and left ovary. 3. Small amount of pelvic free fluid, likely physiologic. Electronically Signed   By: Aram Candela M.D.   On: 05/06/2022 17:57   US Transvaginal Non-OB  Result Date: 05/06/2022 CLINICAL DATA:  Pelvic pain and abnormal vaginal bleeding. EXAM: TRANSABDOMINAL AND TRANSVAGINAL ULTRASOUND OF PELVIS TECHNIQUE: Both transabdominal and transvaginal ultrasound examinations of the pelvis were performed. Transabdominal technique was performed for global imaging of the pelvis including uterus, ovaries, adnexal regions, and pelvic cul-de-sac. It was necessary to proceed with endovaginal exam following the transabdominal exam to visualize the uterus, endometrium, left ovary and bilateral adnexa. COMPARISON:  March 08, 2019 FINDINGS: Uterus Measurements: 7.1 cm x 3.6 cm x 3.1 cm = volume: 42.38 mL. No fibroids or other mass visualized. Endometrium Thickness: 4.1.  No  focal abnormality visualized. Right ovary The right ovary is not visualized secondary to overlying bowel gas. Left ovary Measurements: 1.9 cm x 1.7 cm x 1.8 cm = volume: 2.9 mL. Normal appearance/no adnexal mass. Other findings A small amount of pelvic free fluid is noted. IMPRESSION: 1. Nonvisualization of the right ovary secondary to overlying bowel gas. 2. Normal ultrasonographic appearance of the uterus and left ovary. 3. Small amount of pelvic free fluid, likely physiologic. Electronically Signed   By: Aram Candela M.D.   On: 05/06/2022 17:57    Procedures Procedures    Medications Ordered in ED Medications  cephALEXin (KEFLEX) capsule 500 mg (500 mg Oral Given 05/06/22 1649)  ondansetron (ZOFRAN-ODT) disintegrating tablet 4 mg (4 mg Oral Given 05/06/22 1648)  metroNIDAZOLE (FLAGYL) tablet 500 mg (500 mg Oral Given 05/06/22 1822)    ED Course/ Medical Decision Making/ A&P                           Medical Decision Making  [place] is a 26 y.o. female.  With PMH of allergic rhinitis  otherwise healthy who presents with lower pelvic pain that has been ongoing for the past couple months worse with intercourse.    Based on patient's pelvic pain and friable cervix with CMT, suspect her symptoms are most consistent with cervicitis.  Her UA did suggest possible UTI with moderate hemoglobin, moderate leukocyte esterase and 11-20 WBCs however, with her friable cervix could be related to possible cervicitis.  Her labs were otherwise unremarkable with normal creatinine 0.57 normal white blood cell count 8.0.  Normal hemoglobin 13.8 and normal platelet count to 242  Transvaginal ultrasound was obtained which showed no acute findings was unable to visualize the right adnexa but no concern for torsion or abnormality based off chronicity of pain and no right-sided pain.  I discussed with the patient that based off her exam, most likely an infectious cervicitis.  Gonorrhea chlamydia sample  sent.  Wet prep showed evidence of BV and white blood cells present.  Patient was given Flagyl and Keflex in the ER for UTI and BV.  She does not want to have empiric treatment for STDs at this time and would prefer to follow-up with her MyChart results.  She is going to follow-up with her own OB/GYN.  I will discharge with Keflex and Flagyl and advised safe sex practices until she gets results of her STD testing.  Strict return precaution discussed.  Tylenol and ibuprofen for pain.  She is safe for discharge home.  Amount and/or Complexity of Data Reviewed Radiology: ordered.  Risk Prescription drug management.    Final Clinical Impression(s) / ED Diagnoses Final diagnoses:  Pelvic pain in female  Urinary tract infection without hematuria, site unspecified  Bacterial vaginosis  Friable cervix    Rx / DC Orders ED Discharge Orders          Ordered    metroNIDAZOLE (FLAGYL) 500 MG tablet  2 times daily        05/06/22 1834    cephALEXin (KEFLEX) 500 MG capsule  2 times daily        05/06/22 1834    ondansetron (ZOFRAN-ODT) 4 MG disintegrating tablet  Every 8 hours PRN        05/06/22 1834              Mardene Sayer, MD 05/06/22 [phone]

## 2022-05-06 NOTE — ED Triage Notes (Signed)
Pt reports 2-3 months of abd pain with pelvic pain, has not seen her PCP, bleeding with intercourse Reports sore throat x 2 weeks.

## 2022-05-06 NOTE — ED Notes (Signed)
US at the bedside

## 2022-05-06 NOTE — ED Notes (Signed)
Patient Alert and oriented to baseline. Stable and ambulatory to baseline. Patient verbalized understanding of the discharge instructions.  Patient belongings were taken by the patient.   

## 2022-05-07 LAB — GC/CHLAMYDIA PROBE AMP (~~LOC~~) NOT AT ARMC
Chlamydia: POSITIVE — AB
Comment: NEGATIVE
Comment: NORMAL
Neisseria Gonorrhea: POSITIVE — AB

## 2022-05-07 LAB — RPR: RPR Ser Ql: NONREACTIVE

## 2022-05-08 ENCOUNTER — Emergency Department (HOSPITAL_COMMUNITY)
Admission: EM | Admit: 2022-05-08 | Discharge: 2022-05-08 | Disposition: A | Discharge status: Home / Self Care | Payer: Medicaid Other | Attending: Emergency Medicine | Admitting: Emergency Medicine

## 2022-05-08 ENCOUNTER — Other Ambulatory Visit: Payer: Self-pay

## 2022-05-08 DIAGNOSIS — A64 Unspecified sexually transmitted disease: Secondary | ICD-10-CM

## 2022-05-08 DIAGNOSIS — Z202 Contact with and (suspected) exposure to infections with a predominantly sexual mode of transmission: Secondary | ICD-10-CM | POA: Insufficient documentation

## 2022-05-08 MED ORDER — CEPHALEXIN 500 MG PO CAPS
500.0000 mg | ORAL_CAPSULE | Freq: Two times a day (BID) | ORAL | 0 refills | Status: AC
  Filled 2022-05-08: qty 10, 5d supply, fill #0

## 2022-05-08 MED ORDER — DOXYCYCLINE HYCLATE 100 MG PO TABS: 100.0000 mg | ORAL_TABLET | Freq: Once | ORAL | Status: DC

## 2022-05-08 MED ORDER — ONDANSETRON 4 MG PO TBDP
4.0000 mg | ORAL_TABLET | Freq: Three times a day (TID) | ORAL | 0 refills | Status: DC | PRN
  Filled 2022-05-08: qty 20, 7d supply, fill #0

## 2022-05-08 MED ORDER — DOXYCYCLINE HYCLATE 100 MG PO CAPS: 100.0000 mg | ORAL_CAPSULE | Freq: Two times a day (BID) | ORAL | 0 refills | Status: DC

## 2022-05-08 MED ORDER — DOXYCYCLINE HYCLATE 100 MG PO TABS
100.0000 mg | ORAL_TABLET | Freq: Two times a day (BID) | ORAL | 0 refills | Status: AC
  Filled 2022-05-08: qty 14, 7d supply, fill #0

## 2022-05-08 MED ORDER — DOXYCYCLINE HYCLATE 100 MG PO TABS
100.0000 mg | ORAL_TABLET | Freq: Once | ORAL | Status: AC
  Administered 2022-05-08: 100 mg via ORAL
  Filled 2022-05-08: qty 1

## 2022-05-08 MED ORDER — CEFTRIAXONE SODIUM 1 G IJ SOLR: 500.0000 mg | Freq: Once | INTRAMUSCULAR | Status: DC

## 2022-05-08 MED ORDER — METRONIDAZOLE 500 MG PO TABS
500.0000 mg | ORAL_TABLET | Freq: Two times a day (BID) | ORAL | 0 refills | Status: DC
  Filled 2022-05-08: qty 14, 7d supply, fill #0

## 2022-05-08 MED ORDER — STERILE WATER FOR INJECTION IJ SOLN
INTRAMUSCULAR | Status: AC
  Administered 2022-05-08: 1.4 mL
  Filled 2022-05-08: qty 10

## 2022-05-08 MED ORDER — CEFTRIAXONE SODIUM 1 G IJ SOLR
500.0000 mg | Freq: Once | INTRAMUSCULAR | Status: AC
  Administered 2022-05-08: 500 mg via INTRAMUSCULAR
  Filled 2022-05-08: qty 10

## 2022-05-08 NOTE — Discharge Instructions (Signed)
You have been treated in the emergency department for an infection, possibly sexually transmitted. Results of your gonorrhea and chlamydia tests are pending and you will be notified if they are positive. Please refrain from intercourse for 7 days and until all sex partners (within previous 60 days) are evaluated and/or treated as well. Please follow up with your primary care provider for continued care and further STD evaluation.  It is very important to practice safe sex and use condoms when sexually active. If your results are positive you need to notify all sexual partners so they can be treated as well. The website [email or web address].dontspreadit.[email or web address]/ can be used to send anonymous text messages or emails to alert sexual contacts. Follow up with your doctor, or OBGYN in regards to today's visit.   Gonorrhea and Chlamydia SYMPTOMS  In females, symptoms may go unnoticed. Symptoms that are more noticeable can include:  Belly (abdominal) pain.  Painful intercourse.  Watery mucous-like discharge from the vagina.  Miscarriage.  Discomfort when urinating.  Inflammation of the rectum.  Abnormal gray-green frothy vaginal discharge  Vaginal itching and irritatio  Itching and irritation of the area outside the vagina.   Painful urination.  Bleeding after sexual intercourse.  In males, symptoms include:  Burning with urination.  Pain in the testicles.  Watery mucous-like discharge from the penis.  It can cause longstanding (chronic) pelvic pain after frequent infections.  TREATMENT  PID can cause women to not be able to have children (sterile) if left untreated or if half-treated.  It is important to finish ALL medications given to you.  This is a sexually transmitted infection. So you are also at risk for other sexually transmitted diseases, including HIV (AIDS), it is recommended that you get tested. HOME CARE INSTRUCTIONS  Warning: This infection is contagious. Do not have sex until treatment is  completed. Follow up at your caregiver's office or the clinic to which you were referred. If your diagnosis (learning what is wrong) is confirmed by culture or some other method, your recent sexual contacts need treatment. Even if they are symptom free or have a negative culture or evaluation, they should be treated.  PREVENTION  Women should use sanitary pads instead of tampons for vaginal discharge.  Wipe front to back after using the toilet and avoid douching.   Practice safe sex, use condoms, have only one sex partner and be sure your sex partner is not having sex with others.  Ask your caregiver to test you for chlamydia at your regular checkups or sooner if you are having symptoms.  Ask for further information if you are pregnant.  SEEK IMMEDIATE MEDICAL CARE IF:  You develop an oral temperature above 102 F (38.9 C), not controlled by medications or lasting more than 2 days.  You develop an increase in pain.  You develop any type of abnormal discharge.  You develop vaginal bleeding and it is not time for your period.  You develop painful intercourse.   Bacterial Vaginosis  Bacterial vaginosis (BV) is a vaginal infection where the normal balance of bacteria in the vagina is disrupted. This is not a sexually transmitted disease and your sexual partners do NOT need to be treated. CAUSES  The cause of BV is not fully understood. BV develops when there is an increase or imbalance of harmful bacteria.  Some activities or behaviors can upset the normal balance of bacteria in the vagina and put women at increased risk including:  Having a new sex partner or multiple   sex partners.  Douching.  Using an intrauterine device (IUD) for contraception.  It is not clear what role sexual activity plays in the development of BV. However, women that have never had sexual intercourse are rarely infected with BV.  Women do not get BV from toilet seats, bedding, swimming pools or from touching objects around  them.   SYMPTOMS  Grey vaginal discharge.  A fish-like odor with discharge, especially after sexual intercourse.  Itching or burning of the vagina and vulva.  Burning or pain with urination.  Some women have no signs or symptoms at all.   TREATMENT  Sometimes BV will clear up without treatment.  BV may be treated with antibiotics.  BV can recur after treatment. If this happens, a second round of antibiotics will often be prescribed.  HOME CARE INSTRUCTIONS  Finish all medication as directed by your caregiver.  Do not have sex until treatment is completed.  Do NOT drink any alcoholic beverages while being treated  with Metronidazole (Flagyl). This will cause a severe reaction inducing vomiting.

## 2022-05-08 NOTE — ED Provider Notes (Signed)
[facility] DEPT Provider Note   CSN: [id number] Arrival date & time: 05/08/22  1200     History No chief complaint on file.   Jillian Richardson is a 26 y.o. female. Patient here for treatment of gonorrhea and chlamydia.  She tested positive a couple days ago.  She says she does not have any new symptoms.  She denies fevers chills and pelvic pain.  HPI     Home Medications Prior to Admission medications   Medication Sig Start Date End Date Taking? Authorizing Provider  cephALEXin (KEFLEX) 500 MG capsule Take 1 capsule (500 mg total) by mouth 2 (two) times daily for 5 days. 05/08/22 05/13/22  Lynk Marti, Adora Fridge, PA-C  doxycycline (VIBRAMYCIN) 100 MG capsule Take 1 capsule (100 mg total) by mouth 2 (two) times daily for 7 days. 05/08/22 05/15/22  Tyra Michelle, Adora Fridge, PA-C  metroNIDAZOLE (FLAGYL) 500 MG tablet Take 1 tablet (500 mg total) by mouth 2 (two) times daily. 05/08/22   Vance Belcourt, Adora Fridge, PA-C  ondansetron (ZOFRAN-ODT) 4 MG disintegrating tablet Take 1 tablet (4 mg total) by mouth every 8 (eight) hours as needed for nausea or vomiting. 05/08/22   Rex Magee, Adora Fridge, PA-C      Allergies    Patient has no known allergies.    Review of Systems   Review of Systems  All other systems reviewed and are negative.   Physical Exam Updated Vital Signs BP 115/65 (BP Location: Left Arm)   Pulse 78   Temp 98 F (36.7 C) (Oral)   Resp 18   Ht 4\' 11"  (1.499 m)   Wt 72.5 kg   SpO2 95%   BMI 32.28 kg/m  Physical Exam Vitals and nursing note reviewed.  Constitutional:      General: She is not in acute distress.    Appearance: Normal appearance. She is well-developed. She is not ill-appearing, toxic-appearing or diaphoretic.  HENT:     Head: Normocephalic and atraumatic.     Nose: No nasal deformity.     Mouth/Throat:     Lips: Pink. No lesions.  Eyes:     General: Gaze aligned appropriately. No scleral icterus.       Right eye: No discharge.         Left eye: No discharge.     Conjunctiva/sclera: Conjunctivae normal.     Right eye: Right conjunctiva is not injected. No exudate or hemorrhage.    Left eye: Left conjunctiva is not injected. No exudate or hemorrhage. Pulmonary:     Effort: Pulmonary effort is normal. No respiratory distress.  Skin:    General: Skin is warm and dry.  Neurological:     Mental Status: She is alert and oriented to person, place, and time.  Psychiatric:        Mood and Affect: Mood normal.        Speech: Speech normal.        Behavior: Behavior normal. Behavior is cooperative.     ED Results / Procedures / Treatments   Labs (all labs ordered are listed, but only abnormal results are displayed) Labs Reviewed - No data to display  EKG None  Radiology US Pelvis Complete  Result Date: 05/06/2022 CLINICAL DATA:  Pelvic pain and abnormal vaginal bleeding. EXAM: TRANSABDOMINAL AND TRANSVAGINAL ULTRASOUND OF PELVIS TECHNIQUE: Both transabdominal and transvaginal ultrasound examinations of the pelvis were performed. Transabdominal technique was performed for global imaging of the pelvis including uterus, ovaries, adnexal regions, and pelvic cul-de-sac. It was  necessary to proceed with endovaginal exam following the transabdominal exam to visualize the uterus, endometrium, left ovary and bilateral adnexa. COMPARISON:  March 08, 2019 FINDINGS: Uterus Measurements: 7.1 cm x 3.6 cm x 3.1 cm = volume: 42.38 mL. No fibroids or other mass visualized. Endometrium Thickness: 4.1.  No focal abnormality visualized. Right ovary The right ovary is not visualized secondary to overlying bowel gas. Left ovary Measurements: 1.9 cm x 1.7 cm x 1.8 cm = volume: 2.9 mL. Normal appearance/no adnexal mass. Other findings A small amount of pelvic free fluid is noted. IMPRESSION: 1. Nonvisualization of the right ovary secondary to overlying bowel gas. 2. Normal ultrasonographic appearance of the uterus and left ovary. 3. Small amount of  pelvic free fluid, likely physiologic. Electronically Signed   By: Virgina Norfolk M.D.   On: 05/06/2022 17:57   US Transvaginal Non-OB  Result Date: 05/06/2022 CLINICAL DATA:  Pelvic pain and abnormal vaginal bleeding. EXAM: TRANSABDOMINAL AND TRANSVAGINAL ULTRASOUND OF PELVIS TECHNIQUE: Both transabdominal and transvaginal ultrasound examinations of the pelvis were performed. Transabdominal technique was performed for global imaging of the pelvis including uterus, ovaries, adnexal regions, and pelvic cul-de-sac. It was necessary to proceed with endovaginal exam following the transabdominal exam to visualize the uterus, endometrium, left ovary and bilateral adnexa. COMPARISON:  March 08, 2019 FINDINGS: Uterus Measurements: 7.1 cm x 3.6 cm x 3.1 cm = volume: 42.38 mL. No fibroids or other mass visualized. Endometrium Thickness: 4.1.  No focal abnormality visualized. Right ovary The right ovary is not visualized secondary to overlying bowel gas. Left ovary Measurements: 1.9 cm x 1.7 cm x 1.8 cm = volume: 2.9 mL. Normal appearance/no adnexal mass. Other findings A small amount of pelvic free fluid is noted. IMPRESSION: 1. Nonvisualization of the right ovary secondary to overlying bowel gas. 2. Normal ultrasonographic appearance of the uterus and left ovary. 3. Small amount of pelvic free fluid, likely physiologic. Electronically Signed   By: Virgina Norfolk M.D.   On: 05/06/2022 17:57    Procedures Procedures    Medications Ordered in ED Medications  cefTRIAXone (ROCEPHIN) injection 500 mg (500 mg Intramuscular Given 05/08/22 1350)  doxycycline (VIBRA-TABS) tablet 100 mg (100 mg Oral Given 05/08/22 1348)  sterile water (preservative free) injection (1.4 mLs  Given 05/08/22 1351)    ED Course/ Medical Decision Making/ A&P                           Medical Decision Making Risk Prescription drug management.   Patient has positive chlamydia and gonorrhea results done from 2 days ago.  She is  asymptomatic and low suspicion for PID.  I have given her a shot of Rocephin here and will discharge on doxycycline for treatment.  So recently the medications she was given at her prior visit to a new pharmacy  Final Clinical Impression(s) / ED Diagnoses Final diagnoses:  STD (sexually transmitted disease)    Rx / DC Orders ED Discharge Orders          Ordered    doxycycline (VIBRAMYCIN) 100 MG capsule  2 times daily,   Status:  Discontinued        05/08/22 1337    doxycycline (VIBRAMYCIN) 100 MG capsule  2 times daily        05/08/22 1340    cephALEXin (KEFLEX) 500 MG capsule  2 times daily        05/08/22 1340    metroNIDAZOLE (FLAGYL) 500 MG tablet  2 times daily        05/08/22 1340    ondansetron (ZOFRAN-ODT) 4 MG disintegrating tablet  Every 8 hours PRN        05/08/22 1340              Adalyne Lovick, Adora Fridge, PA-C 05/08/22 1442    Varney Biles, MD 05/13/22 0038

## 2022-05-08 NOTE — ED Triage Notes (Signed)
Pt presents from home for treatment of STI, [facility] that she tested positive for chlamydia and gonorrhea on 10/4.

## 2022-07-08 ENCOUNTER — Encounter: Payer: Medicaid Other | Admitting: Family Medicine

## 2022-07-09 ENCOUNTER — Encounter: Payer: Medicaid Other | Admitting: Obstetrics and Gynecology

## 2022-07-26 ENCOUNTER — Other Ambulatory Visit: Payer: Self-pay

## 2022-07-26 ENCOUNTER — Emergency Department (HOSPITAL_COMMUNITY)
Admission: EM | Admit: 2022-07-26 | Discharge: 2022-07-26 | Disposition: A | Discharge status: Home / Self Care | Payer: Medicaid Other | Attending: Emergency Medicine | Admitting: Emergency Medicine

## 2022-07-26 DIAGNOSIS — U071 COVID-19: Secondary | ICD-10-CM | POA: Diagnosis not present

## 2022-07-26 DIAGNOSIS — R042 Hemoptysis: Secondary | ICD-10-CM | POA: Diagnosis present

## 2022-07-26 LAB — RESP PANEL BY RT-PCR (RSV, FLU A&B, COVID)  RVPGX2
Influenza A by PCR: NEGATIVE
Influenza B by PCR: NEGATIVE
Resp Syncytial Virus by PCR: NEGATIVE
SARS Coronavirus 2 by RT PCR: POSITIVE — AB

## 2022-07-26 MED ORDER — BENZONATATE 100 MG PO CAPS: 100.0000 mg | ORAL_CAPSULE | Freq: Three times a day (TID) | ORAL | 0 refills | Status: DC

## 2022-07-26 MED ORDER — ONDANSETRON 4 MG PO TBDP: ORAL_TABLET | ORAL | 0 refills | Status: DC

## 2022-07-26 MED ORDER — ONDANSETRON 4 MG PO TBDP
ORAL_TABLET | ORAL | 0 refills | Status: DC
  Filled 2022-07-26: qty 20, 3d supply, fill #0

## 2022-07-26 MED ORDER — BENZONATATE 100 MG PO CAPS
100.0000 mg | ORAL_CAPSULE | Freq: Three times a day (TID) | ORAL | 0 refills | Status: DC
  Filled 2022-07-26: qty 21, 7d supply, fill #0

## 2022-07-26 NOTE — ED Provider Notes (Signed)
[facility]-EMERGENCY DEPT Provider Note   CSN: [id number] Arrival date & time: 07/26/22  1643     History  Chief Complaint  Patient presents with   Hemoptysis    Jillian Richardson is a 26 y.o. female.  26 yo F with a chief complaints of cough.  Going on for about 4 days.  Having fevers and chills.  Noticed some blood-tinged sputum and came to the ED for evaluation.  No known sick contacts.  Eating and drinking normally.        Home Medications Prior to Admission medications   Medication Sig Start Date End Date Taking? Authorizing Provider  benzonatate (TESSALON) 100 MG capsule Take 1 capsule (100 mg total) by mouth every 8 (eight) hours. 07/26/22   Melene Plan, DO  metroNIDAZOLE (FLAGYL) 500 MG tablet Take 1 tablet (500 mg total) by mouth 2 (two) times daily. 05/08/22   Loeffler, Finis Bud, PA-C  ondansetron (ZOFRAN-ODT) 4 MG disintegrating tablet 4mg  ODT q4 hours prn nausea/vomit 07/26/22   07/28/22, DO      Allergies    Patient has no known allergies.    Review of Systems   Review of Systems  Physical Exam Updated Vital Signs BP 112/84 (BP Location: Left Arm)   Pulse 80   Temp 98.2 F (36.8 C) (Oral)   Resp 18   Ht 4\' 11"  (1.499 m)   Wt 72.6 kg   SpO2 100%   BMI 32.32 kg/m  Physical Exam Vitals and nursing note reviewed.  Constitutional:      General: She is not in acute distress.    Appearance: She is well-developed. She is not diaphoretic.  HENT:     Head: Normocephalic and atraumatic.     Comments: Swollen turbinates, posterior nasal drip, no noted sinus ttp, tm normal bilaterally.   Eyes:     Pupils: Pupils are equal, round, and reactive to light.  Cardiovascular:     Rate and Rhythm: Normal rate and regular rhythm.     Heart sounds: No murmur heard.    No friction rub. No gallop.  Pulmonary:     Effort: Pulmonary effort is normal.     Breath sounds: No wheezing or rales.  Abdominal:     General: There is no distension.      Palpations: Abdomen is soft.     Tenderness: There is no abdominal tenderness.  Musculoskeletal:        General: No tenderness.     Cervical back: Normal range of motion and neck supple.  Skin:    General: Skin is warm and dry.  Neurological:     Mental Status: She is alert and oriented to person, place, and time.  Psychiatric:        Behavior: Behavior normal.     ED Results / Procedures / Treatments   Labs (all labs ordered are listed, but only abnormal results are displayed) Labs Reviewed  RESP PANEL BY RT-PCR (RSV, FLU A&B, COVID)  [id number] - Abnormal; Notable for the following components:      Result Value   SARS Coronavirus 2 by RT PCR POSITIVE (*)    All other components within normal limits    EKG None  Radiology No results found.  Procedures Procedures    Medications Ordered in ED Medications - No data to display  ED Course/ Medical Decision Making/ A&P  Medical Decision Making Risk Prescription drug management.   26 yo F with a chief complaints of an upper respiratory illness.  Cough congestion going on for about 4 days.  She was concerned about blood-tinged sputum.  Well-appearing nontoxic.  Clear lung sounds.  Think unlikely to have a pulmonary embolism.  She is COVID-positive here.  Will treat supportively.  PCP follow-up.  8:00 PM:  I have discussed the diagnosis/risks/treatment options with the patient.  Evaluation and diagnostic testing in the emergency department does not suggest an emergent condition requiring admission or immediate intervention beyond what has been performed at this time.  They will follow up with PCP. We also discussed returning to the ED immediately if new or worsening sx occur. We discussed the sx which are most concerning (e.g., sudden worsening pain, fever, inability to tolerate by mouth) that necessitate immediate return. Medications administered to the patient during their visit and any new  prescriptions provided to the patient are listed below.  Medications given during this visit Medications - No data to display   The patient appears reasonably screen and/or stabilized for discharge and I doubt any other medical condition or other [facility] requiring further screening, evaluation, or treatment in the ED at this time prior to discharge.          Final Clinical Impression(s) / ED Diagnoses Final diagnoses:  COVID-19 virus infection    Rx / DC Orders ED Discharge Orders          Ordered    benzonatate (TESSALON) 100 MG capsule  Every 8 hours,   Status:  Discontinued        07/26/22 1835    ondansetron (ZOFRAN-ODT) 4 MG disintegrating tablet  Status:  Discontinued        07/26/22 1835    benzonatate (TESSALON) 100 MG capsule  Every 8 hours        07/26/22 1945    ondansetron (ZOFRAN-ODT) 4 MG disintegrating tablet        07/26/22 1945              Melene Plan, DO 07/26/22 2000

## 2022-07-26 NOTE — ED Provider Triage Note (Signed)
Emergency Medicine Provider Triage Evaluation Note  [facility] , a 26 y.o. female  was evaluated in triage.  Pt complains of cough, congestion, sore throat, fever x 3 days. Cough started off dry then productive with yellow sputum now slightly blood tinged. Fever resolved over a day ago. No chest pain, SOB, abd pain, nausea, vomiting, diarrhea. 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 No flu vaccination this season.   Review of Systems  Positive: See HPI Negative: See HPI  Physical Exam  BP 114/61 (BP Location: Left Arm)   Pulse 84   Temp 98.2 F (36.8 C) (Oral)   Resp 18   Ht 4\' 11"  (1.499 m)   Wt 72.6 kg   SpO2 98%   BMI 32.32 kg/m  Gen:   Awake, no distress   Resp:  Normal effort LCTA MSK:   Moves extremities without difficulty  Other:  Moderate posterior pharyngeal edema without exudates, minimal nasal congestion, regular rate and rhythm, no lymphadenopathy, no sinus tenderness  Medical Decision Making  Medically screening exam initiated at 5:20 PM.  Appropriate orders placed.  [facility] was informed that the remainder of the evaluation will be completed by another provider, this initial triage assessment does not replace that evaluation, and the importance of remaining in the ED until their evaluation is complete.     [employer] PA-C 07/26/22 1722

## 2022-07-26 NOTE — Discharge Instructions (Signed)
Take tylenol 2 pills 4 times a day and motrin 4 pills 3 times a day.  Drink plenty of fluids.  Return for worsening shortness of breath, headache, confusion. Follow up with your family doctor.   

## 2022-07-26 NOTE — ED Triage Notes (Signed)
Pt arrived via POV. Pt has been having sore throat, sinus congestion and cough for 5x days. Cough up yellow phlegm with some small blood streaks today.  AOx4

## 2022-07-28 ENCOUNTER — Other Ambulatory Visit: Payer: Self-pay

## 2022-08-07 ENCOUNTER — Encounter: Payer: Medicaid Other | Admitting: Obstetrics & Gynecology

## 2022-08-31 ENCOUNTER — Ambulatory Visit
Admission: EM | Admit: 2022-08-31 | Discharge: 2022-08-31 | Disposition: A | Discharge status: Home / Self Care | Payer: Medicaid Other | Attending: Urgent Care | Admitting: Urgent Care

## 2022-08-31 ENCOUNTER — Other Ambulatory Visit: Payer: Self-pay

## 2022-08-31 DIAGNOSIS — J020 Streptococcal pharyngitis: Secondary | ICD-10-CM | POA: Diagnosis not present

## 2022-08-31 DIAGNOSIS — B349 Viral infection, unspecified: Secondary | ICD-10-CM

## 2022-08-31 LAB — POCT RAPID STREP A (OFFICE): Rapid Strep A Screen: POSITIVE — AB

## 2022-08-31 MED ORDER — PSEUDOEPHEDRINE HCL 60 MG PO TABS
60.0000 mg | ORAL_TABLET | Freq: Three times a day (TID) | ORAL | 0 refills | Status: AC | PRN
  Filled 2022-08-31: qty 30, 10d supply, fill #0

## 2022-08-31 MED ORDER — AMOXICILLIN 500 MG PO CAPS
500.0000 mg | ORAL_CAPSULE | Freq: Two times a day (BID) | ORAL | 0 refills | Status: AC
  Filled 2022-08-31: qty 20, 10d supply, fill #0

## 2022-08-31 MED ORDER — IBUPROFEN 600 MG PO TABS
600.0000 mg | ORAL_TABLET | Freq: Four times a day (QID) | ORAL | 0 refills | Status: AC | PRN
  Filled 2022-08-31: qty 30, 8d supply, fill #0

## 2022-08-31 MED ORDER — CETIRIZINE HCL 10 MG PO TABS
10.0000 mg | ORAL_TABLET | Freq: Every day | ORAL | 0 refills | Status: AC
  Filled 2022-08-31: qty 30, 30d supply, fill #0

## 2022-08-31 NOTE — ED Triage Notes (Signed)
Pt c/o body aches, HA, runny nose, sore throat started this am-NAD-steady gait

## 2022-08-31 NOTE — ED Provider Notes (Signed)
[facility]  Note:  This document was prepared using Systems analyst and may include unintentional dictation errors.  MRN: [id number] DOB: 1995-12-14  Subjective:   Jillian Richardson is a 27 y.o. female presenting for acute onset since this morning of body pains, headache, runny and stuffy nose, throat pain, painful swallowing.  Tested positive for COVID-19 07/26/2022.  Patient would like to be checked for strep.  No cough, chest pain, shortness of breath or wheezing.  No history of asthma.  Has a history of allergies.  Does not take anything consistently for this.  No chronic medications.    No Known Allergies  Past Medical History:  Diagnosis Date   Allergic rhinitis 10/23/2013   Allergy      Past Surgical History:  Procedure Laterality Date   NO PAST SURGERIES      Family History  Problem Relation Age of Onset   Diabetes Mother    Diabetes Father     Social History   Tobacco Use   Smoking status: Never   Smokeless tobacco: Never  Vaping Use   Vaping Use: Never used  Substance Use Topics   Alcohol use: No   Drug use: No    ROS   Objective:   Vitals: BP 112/71 (BP Location: Right Arm)   Pulse 95   Temp 99.1 F (37.3 C) (Oral)   Resp 20   LMP 08/26/2022 (Approximate)   SpO2 98%   Physical Exam Constitutional:      General: She is not in acute distress.    Appearance: Normal appearance. She is well-developed and normal weight. She is not ill-appearing, toxic-appearing or diaphoretic.  HENT:     Head: Normocephalic and atraumatic.     Right Ear: Tympanic membrane, ear canal and external ear normal. No drainage or tenderness. No middle ear effusion. There is no impacted cerumen. Tympanic membrane is not erythematous or bulging.     Left Ear: Tympanic membrane, ear canal and external ear normal. No drainage or tenderness.  No middle ear effusion. There is no impacted cerumen. Tympanic membrane is not erythematous or  bulging.     Nose: Nose normal. No congestion or rhinorrhea.     Mouth/Throat:     Mouth: Mucous membranes are moist. No oral lesions.     Pharynx: No pharyngeal swelling, oropharyngeal exudate, posterior oropharyngeal erythema or uvula swelling.     Tonsils: No tonsillar exudate or tonsillar abscesses. 0 on the right. 0 on the left.  Eyes:     General: No scleral icterus.       Right eye: No discharge.        Left eye: No discharge.     Extraocular Movements: Extraocular movements intact.     Right eye: Normal extraocular motion.     Left eye: Normal extraocular motion.     Conjunctiva/sclera: Conjunctivae normal.  Cardiovascular:     Rate and Rhythm: Normal rate.  Pulmonary:     Effort: Pulmonary effort is normal.  Musculoskeletal:     Cervical back: Normal range of motion and neck supple.  Lymphadenopathy:     Cervical: No cervical adenopathy.  Skin:    General: Skin is warm and dry.  Neurological:     General: No focal deficit present.     Mental Status: She is alert and oriented to person, place, and time.  Psychiatric:        Mood and Affect: Mood normal.  Behavior: Behavior normal.     Results for orders placed or performed during the hospital encounter of 08/31/22 (from the past 24 hour(s))  POCT rapid strep A     Status: Abnormal   Collection Time: 08/31/22 11:35 AM  Result Value Ref Range   Rapid Strep A Screen Positive (A) Negative    Assessment and Plan :   PDMP not reviewed this encounter.  1. Strep pharyngitis     Deferred imaging given clear cardiopulmonary exam, hemodynamically stable vital signs. Will treat for strep pharyngitis.  Patient is to start amoxicillin, use supportive care otherwise. Counseled patient on potential for adverse effects with medications prescribed/recommended today, ER and return-to-clinic precautions discussed, patient verbalized understanding.    Jaynee Eagles, [place] 08/31/22 1143

## 2022-09-01 ENCOUNTER — Encounter: Payer: Self-pay | Admitting: Obstetrics and Gynecology

## 2022-09-01 ENCOUNTER — Other Ambulatory Visit (HOSPITAL_COMMUNITY)
Admission: RE | Admit: 2022-09-01 | Discharge: 2022-09-01 | Disposition: A | Discharge status: Home / Self Care | Payer: Medicaid Other | Source: Ambulatory Visit | Attending: Obstetrics and Gynecology | Admitting: Obstetrics and Gynecology

## 2022-09-01 ENCOUNTER — Ambulatory Visit (INDEPENDENT_AMBULATORY_CARE_PROVIDER_SITE_OTHER): Payer: Medicaid Other | Admitting: Obstetrics and Gynecology

## 2022-09-01 ENCOUNTER — Other Ambulatory Visit: Payer: Self-pay

## 2022-09-01 VITALS — BP 100/53 | HR 80 | Ht 59.0 in | Wt 169.6 lb

## 2022-09-01 DIAGNOSIS — Z01419 Encounter for gynecological examination (general) (routine) without abnormal findings: Secondary | ICD-10-CM | POA: Diagnosis not present

## 2022-09-01 DIAGNOSIS — Z113 Encounter for screening for infections with a predominantly sexual mode of transmission: Secondary | ICD-10-CM | POA: Diagnosis not present

## 2022-09-01 NOTE — Progress Notes (Signed)
ANNUAL EXAM Patient name: Jillian Richardson MRN [id number]  Date of birth: [birth year] Chief Complaint:   No chief complaint on file.  History of Present Illness:   Jillian Richardson is a 26 y.o. G2P0 being seen today for a routine annual exam.  Current complaints: STI check and pap   Patient's last menstrual period was 08/26/2022 (approximate).  Reports would like to have STI testing today. Completed ABX at the time of diagnosis. Denies sexual contact with prior or new partners in the interim and not currently sexually active. Denies menstrual changes or concerns. Denies bowel and urinary concerns. No breast or nipple changes. Recently diagnosed with strep throat, reports using a mask when walking about.    The pregnancy intention screening data noted above was reviewed. Potential methods of contraception were discussed. The patient elected to proceed with No data recorded.   Last pap   Last mammogram: n/a Last colonoscopy: n/a     02/09/2020    8:08 AM 03/16/2019   10:42 AM 05/06/2018    9:04 AM 01/31/2018   10:42 AM 12/29/2017    9:13 AM  Depression screen PHQ 2/9  Decreased Interest 0 0 0 0 0  Down, Depressed, Hopeless 0 0 0 0 0  PHQ - 2 Score 0 0 0 0 0  Altered sleeping 0      Tired, decreased energy 0      Change in appetite 0      Feeling bad or failure about yourself  0      Trouble concentrating 0      Moving slowly or fidgety/restless 0      Suicidal thoughts 0      PHQ-9 Score 0             No data to display           Review of Systems:   Pertinent items are noted in HPI Denies any headaches, blurred vision, fatigue, shortness of breath, chest pain, abdominal pain, abnormal vaginal discharge/itching/odor/irritation, problems with periods, bowel movements, urination, or intercourse unless otherwise stated above. Pertinent History Reviewed:  Reviewed past medical,surgical, social and family history.  Reviewed problem list, medications and  allergies. Physical Assessment:   Vitals:   09/01/22 1401  BP: (!) 100/53  Pulse: 80  Weight: 169 lb 9.6 oz (76.9 kg)  Height: 4\' 11"  (1.499 m)  Body mass index is 34.26 kg/m.        Physical Examination:   General appearance - well appearing, and in no distress  Mental status - alert, oriented to person, place, and time  Psych:  She has a normal mood and affect  Skin - warm and dry, normal color, no suspicious lesions noted  Chest - effort normal, all lung fields clear to auscultation bilaterally  Heart - normal rate and regular rhythm  Abdomen - soft, nontender, nondistended, no masses or organomegaly  Pelvic -  VULVA: normal appearing vulva with no masses, tenderness or lesions   VAGINA: normal appearing vagina with normal color and discharge, no lesions   CERVIX: normal appearing cervix without discharge or lesions, no CMT  Thin prep pap is done with reflex HR HPV cotesting  UTERUS: uterus is felt to be normal size, shape, consistency and nontender   ADNEXA: No adnexal masses or tenderness noted.  Extremities:  No swelling or varicosities noted  Chaperone present for exam  No results found for this or any previous visit (from the past 24 hour(s)).    Assessment &  Plan:  1. Routine screening for STI (sexually transmitted infection) STI screening per request. Not currently sexually active. Routine pap  collected.  - Cytology - PAP - Cervicovaginal ancillary only   No orders of the defined types were placed in this encounter.   Meds: No orders of the defined types were placed in this encounter.   Follow-up: No follow-ups on file.  Darliss Cheney, MD 09/01/2022 2:07 PM

## 2022-09-02 LAB — CERVICOVAGINAL ANCILLARY ONLY
Bacterial Vaginitis (gardnerella): POSITIVE — AB
Candida Glabrata: NEGATIVE
Candida Vaginitis: NEGATIVE
Chlamydia: NEGATIVE
Comment: NEGATIVE
Comment: NEGATIVE
Comment: NEGATIVE
Comment: NEGATIVE
Comment: NEGATIVE
Comment: NORMAL
Neisseria Gonorrhea: NEGATIVE
Trichomonas: NEGATIVE

## 2022-09-03 ENCOUNTER — Other Ambulatory Visit: Payer: Self-pay

## 2022-09-03 ENCOUNTER — Other Ambulatory Visit: Payer: Self-pay | Admitting: Obstetrics and Gynecology

## 2022-09-03 ENCOUNTER — Ambulatory Visit: Payer: Medicaid Other | Admitting: Family Medicine

## 2022-09-03 DIAGNOSIS — B9689 Other specified bacterial agents as the cause of diseases classified elsewhere: Secondary | ICD-10-CM

## 2022-09-03 MED ORDER — METRONIDAZOLE 500 MG PO TABS
500.0000 mg | ORAL_TABLET | Freq: Two times a day (BID) | ORAL | 0 refills | Status: AC
  Filled 2022-09-03: qty 14, 7d supply, fill #0

## 2022-09-04 ENCOUNTER — Other Ambulatory Visit: Payer: Self-pay

## 2022-09-10 LAB — CYTOLOGY - PAP
Comment: NEGATIVE
Diagnosis: UNDETERMINED — AB
High risk HPV: POSITIVE — AB

## 2022-09-17 ENCOUNTER — Other Ambulatory Visit: Payer: Self-pay

## 2022-10-14 ENCOUNTER — Ambulatory Visit: Payer: 59 | Admitting: Obstetrics and Gynecology

## 2023-05-16 IMAGING — DX DG CHEST 2V
2 series · 2 of 2 positions shown · non-contrast
Comparison: Chest radiograph 03/24/2021

CLINICAL DATA: COVID positive, cough, sore throat, body aches

EXAM:
CHEST - 2 VIEW

[chest pa]
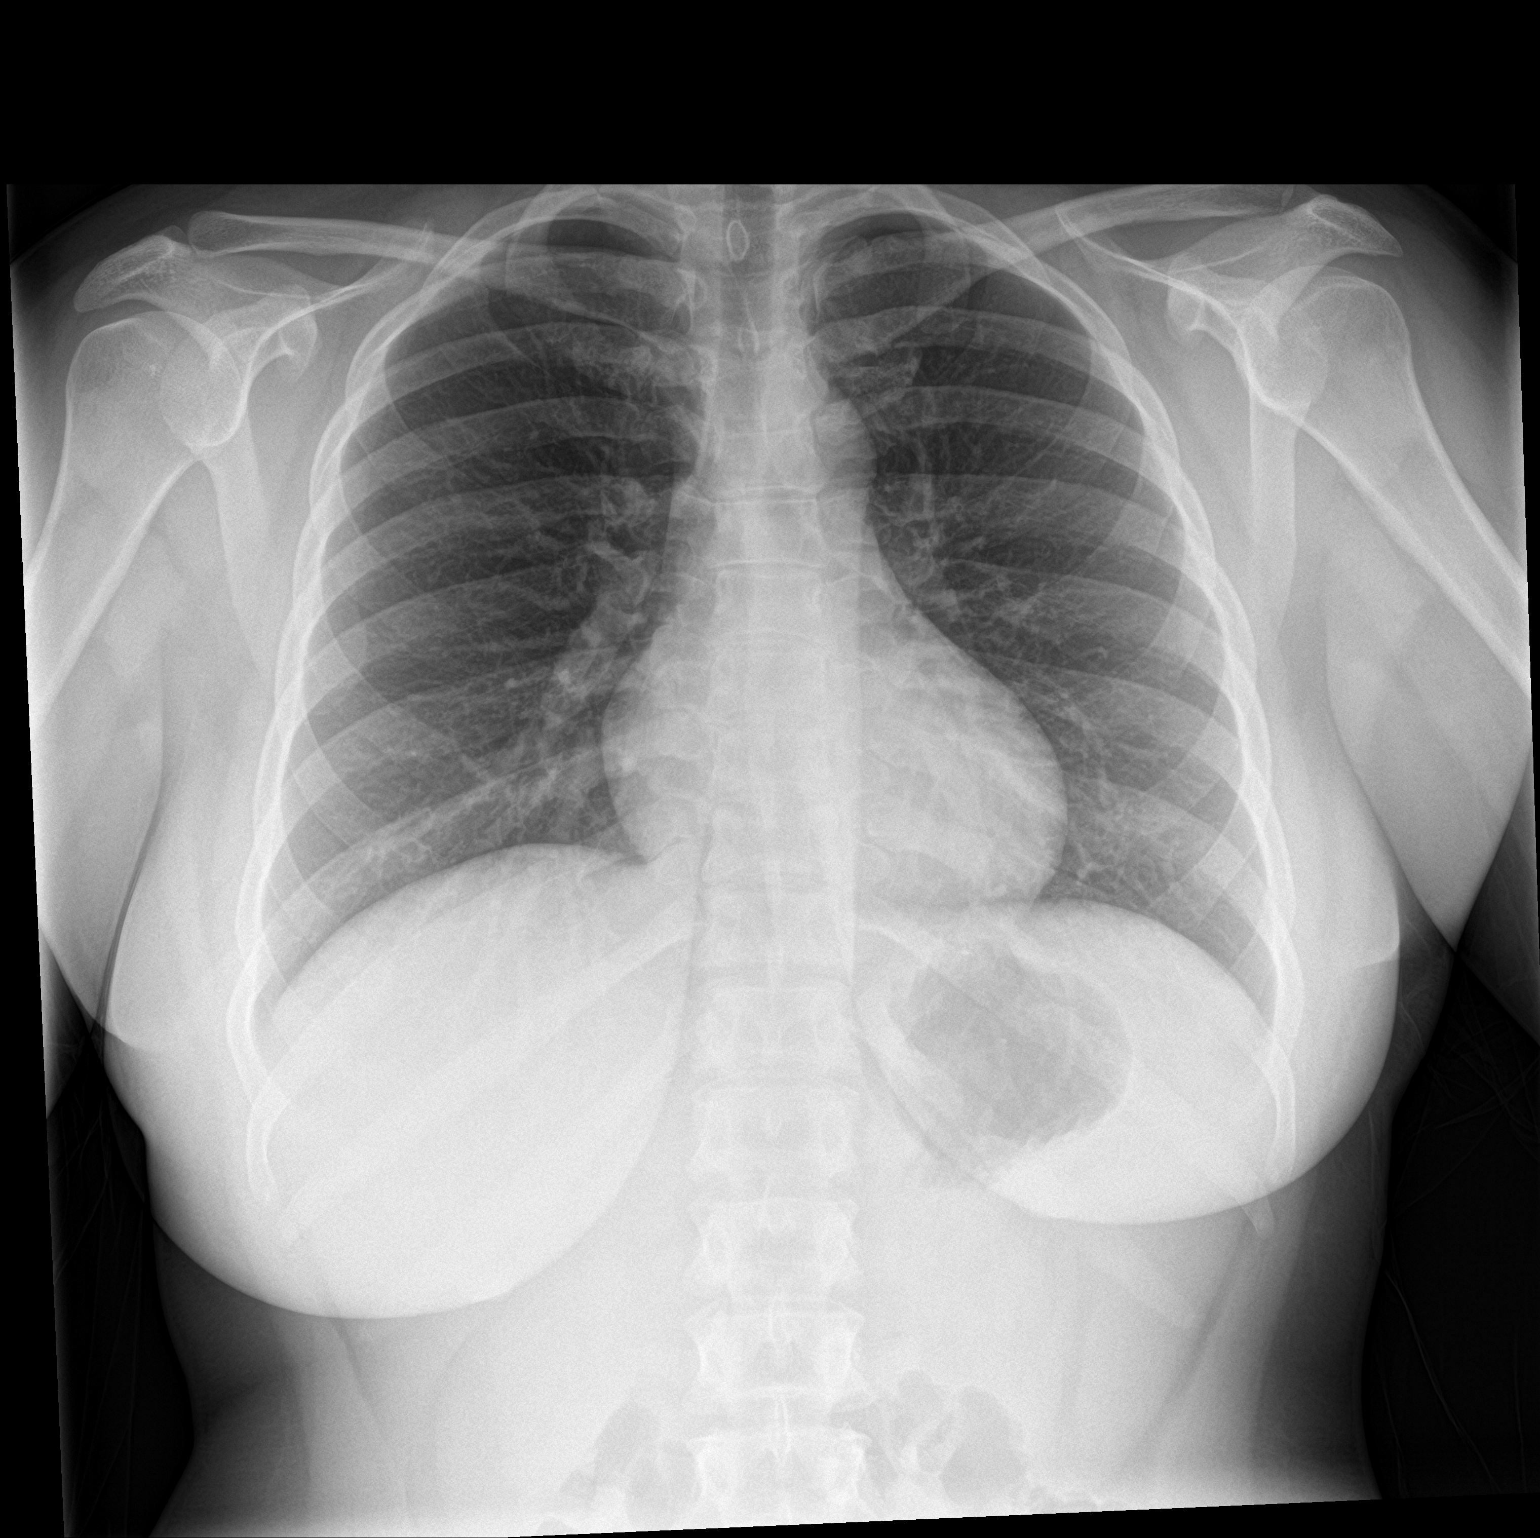

[chest lat]
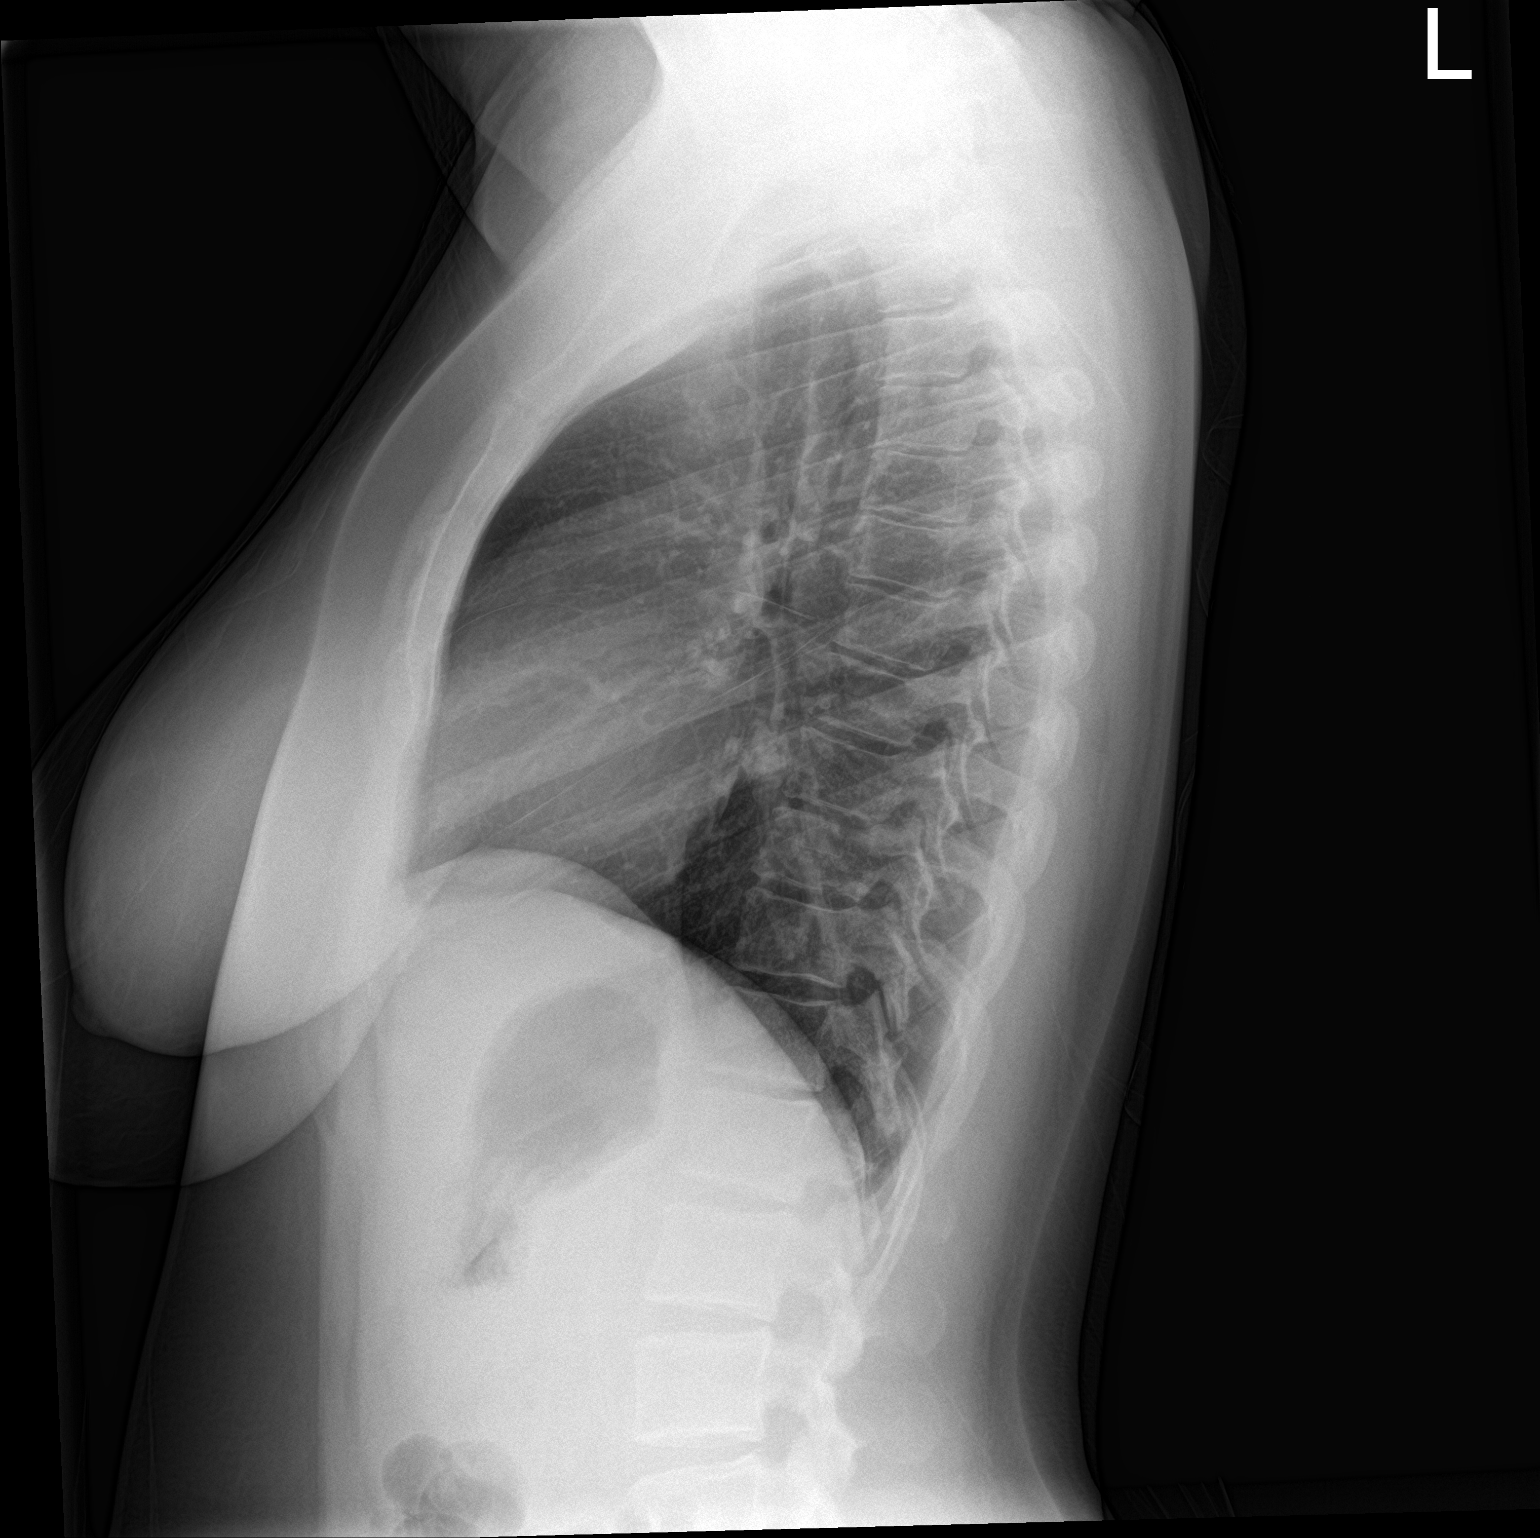

[2 of 2 positions shown; findings below may reference images not displayed]

FINDINGS: The cardiomediastinal silhouette is normal.

The lungs clear, without focal consolidation or pulmonary edema.
There is no pleural effusion or pneumothorax.

There is no acute osseous abnormality.
IMPRESSION: Stable chest with no radiographic evidence of acute cardiopulmonary
process.
